# Patient Record
Sex: Female | Born: 1985 | Race: Black or African American | Hispanic: No | Marital: Single | Smoking: Never smoker
Health system: Southern US, Community
[De-identification: ages and names within clinical notes are randomized; demographics above are authoritative.]

## PROBLEM LIST (undated history)

## (undated) DIAGNOSIS — F419 Anxiety disorder, unspecified: Secondary | ICD-10-CM

## (undated) HISTORY — DX: Anxiety disorder, unspecified: F41.9

---

## 2011-05-19 ENCOUNTER — Emergency Department (INDEPENDENT_AMBULATORY_CARE_PROVIDER_SITE_OTHER)
Admission: EM | Admit: 2011-05-19 | Discharge: 2011-05-19 | Disposition: A | Discharge status: Home / Self Care | Payer: Self-pay | Source: Home / Self Care | Attending: Family Medicine | Admitting: Family Medicine

## 2011-05-19 ENCOUNTER — Encounter: Payer: Self-pay | Admitting: *Deleted

## 2011-05-19 DIAGNOSIS — H6980 Other specified disorders of Eustachian tube, unspecified ear: Secondary | ICD-10-CM

## 2011-05-19 DIAGNOSIS — S025XXA Fracture of tooth (traumatic), initial encounter for closed fracture: Secondary | ICD-10-CM

## 2011-05-19 MED ORDER — LORATADINE 10 MG PO TABS: 10.0000 mg | ORAL_TABLET | Freq: Every day | ORAL | Status: DC

## 2011-05-19 MED ORDER — FLUTICASONE PROPIONATE 50 MCG/ACT NA SUSP: 2.0000 | Freq: Every day | NASAL | Status: DC

## 2011-05-19 MED ORDER — HYDROCODONE-ACETAMINOPHEN 5-325 MG PO TABS: ORAL_TABLET | ORAL | Status: DC

## 2011-05-19 NOTE — ED Notes (Signed)
Pt  Reports  Symptoms  Of  Bilateral   Earache  As  Well as       Toothache  Which  She  Reports  She  Has  Had  For  About  Several  Weeks  She  Appears in  No  Distress  She  Is  Sitting on exam     Table        Talking on  Telephone

## 2011-05-19 NOTE — ED Provider Notes (Signed)
History     CSN: [id number] Arrival date & time: 05/19/2011  2:19 PM   First MD Initiated Contact with Patient 05/19/11 1630      Chief Complaint  Patient presents with  . Otalgia    (Consider location/radiation/quality/duration/timing/severity/associated sxs/prior treatment) Patient is a 25 y.o. female presenting with ear pain. The history is provided by the patient. The history is limited by a language barrier. A language interpreter was used.  Otalgia This is a new problem. The current episode started more than 2 days ago. There is pain in both ears. The problem occurs constantly. The problem has not changed since onset.There has been no fever. The pain is moderate. Pertinent negatives include no ear discharge, no hearing loss, no rhinorrhea, no sore throat and no cough. Her past medical history does not include hearing loss.    History reviewed. No pertinent past medical history.  History reviewed. No pertinent past surgical history.  History reviewed. No pertinent family history.  History  Substance Use Topics  . Smoking status: Not on file  . Smokeless tobacco: Not on file  . Alcohol Use: No    OB History    Grav Para Term Preterm Abortions TAB SAB Ect Mult Living                  Review of Systems  Constitutional: Negative.   HENT: Positive for ear pain. Negative for hearing loss, congestion, sore throat, rhinorrhea, sneezing, trouble swallowing, tinnitus and ear discharge.   Eyes: Negative.   Respiratory: Negative for cough, shortness of breath and wheezing.   Cardiovascular: Negative.   Gastrointestinal: Negative.   Genitourinary: Negative.     Allergies  Review of patient's allergies indicates no known allergies.  Home Medications  No current outpatient prescriptions on file.  BP 113/71  Pulse 52  Temp(Src) 98.5 F (36.9 C) (Oral)  Resp 16  SpO2 100%  LMP 05/18/2011  Physical Exam  Constitutional: She appears well-developed and well-nourished.    HENT:  Head: Normocephalic and atraumatic.  Right Ear: Hearing normal. No lacerations. No drainage. No foreign bodies. Tympanic membrane is retracted. Tympanic membrane is not erythematous and not bulging. No middle ear effusion. No hemotympanum.  Left Ear: Hearing normal. No lacerations. No drainage. No foreign bodies. Tympanic membrane is retracted. Tympanic membrane is not erythematous and not bulging.  No middle ear effusion. No hemotympanum.  Nose: Nose normal.  Mouth/Throat: Uvula is midline. Abnormal dentition. No dental abscesses.       Lower LEFT premolar cracked; no erythema or fluctuance  Eyes: EOM are normal. Pupils are equal, round, and reactive to light.  Cardiovascular: Normal rate and regular rhythm.   Pulmonary/Chest: Breath sounds normal.  Skin: Skin is warm and dry.    ED Course  Procedures (including critical care time)  Labs Reviewed - No data to display No results found.   No diagnosis found.    MDM  Will treat for eustachian tube dysfunction and refer to Medicaid accepting dental providers for repair of tooth.        Richardo Priest, MD 05/19/11 1700

## 2011-06-18 ENCOUNTER — Ambulatory Visit (HOSPITAL_COMMUNITY): Payer: Self-pay | Admitting: Physician Assistant

## 2011-06-23 ENCOUNTER — Ambulatory Visit (INDEPENDENT_AMBULATORY_CARE_PROVIDER_SITE_OTHER): Payer: Self-pay | Admitting: Psychology

## 2011-06-23 DIAGNOSIS — F4310 Post-traumatic stress disorder, unspecified: Secondary | ICD-10-CM
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
## 2011-06-24 ENCOUNTER — Encounter (HOSPITAL_COMMUNITY): Payer: Self-pay | Admitting: Psychology

## 2011-07-22 ENCOUNTER — Ambulatory Visit (INDEPENDENT_AMBULATORY_CARE_PROVIDER_SITE_OTHER): Payer: Self-pay | Admitting: Psychology

## 2011-07-22 DIAGNOSIS — F4310 Post-traumatic stress disorder, unspecified: Secondary | ICD-10-CM

## 2011-07-22 NOTE — Progress Notes (Signed)
   THERAPIST PROGRESS NOTE  Session Time: 1:05pm-1:30pm  Participation Level: Active  Behavioral Response: Well GroomedAlertEuthymic  Type of Therapy: Individual Therapy  Treatment Goals addressed: Diagnosis: PTSD  Interventions: Strength-based  Summary: Joanna Ashley is a 26 y.o. female who presents with interpreter w/ full and bright affect.  pt reports she is having no problems w/ mood, sleep or any concerns to be addressed in counseling.  pt feels she is dealing w/ normal transition to a new culture.  She feels that others in [employer] perceived her to be under stress she is not.  She denied any PTSD symptoms. Pt was aware if need counseling services in future she could return at that time.  Pt reported only complaint was some ear pain, toothache and not yet receiving her medicaid card.   Suicidal/Homicidal: Nowithout intent/plan  Therapist Response: Assessed pt current functioning per her report.  Discussed the purposes of services offered in this clinic and that services are voluntary.  Encouraged pt to f/u w/ [employer] case manager about other needs.    Plan: d/c from counseling as no current needs or symptoms per pt report.  Diagnosis: Axis I: PTSD- reported resolved    Axis II: No diagnosis    YATES,LEANNE, LPC 07/22/2011

## 2011-07-30 ENCOUNTER — Ambulatory Visit (HOSPITAL_COMMUNITY): Payer: Self-pay | Admitting: Physician Assistant

## 2011-08-06 ENCOUNTER — Encounter (HOSPITAL_COMMUNITY): Payer: Self-pay | Admitting: Psychology

## 2011-08-06 NOTE — Progress Notes (Signed)
Addended by: Chad Tiznado M on: 08/06/2011 10:06 AM   Modules accepted: Level of Service  

## 2011-08-06 NOTE — Progress Notes (Signed)
Outpatient Therapist Discharge Summary  Joanna Ashley    30-Jul-1985   Admission Date: 06/23/11   Discharge Date:  07/22/10 Reason for Discharge:  Treatment Completed/Pt reports no needs for tx Medications:  none Diagnosis:  Axis I:  v71.09   Axis II:  v71.09  Axis III:  none  Axis IV:  Refugee living in the [place]  Axis V:  70  Comments:  Pt attended intake and one f/u session- informing no need for services as doing well and no current PTSD symptoms  Forde Radon, [facility]

## 2011-09-03 ENCOUNTER — Ambulatory Visit (HOSPITAL_COMMUNITY): Payer: Self-pay | Admitting: Physician Assistant

## 2011-09-23 ENCOUNTER — Telehealth (HOSPITAL_COMMUNITY): Payer: Self-pay

## 2011-09-23 NOTE — Telephone Encounter (Signed)
FYI.....  Joanna Ashley from [facility] called to follow-up on Joanna Ashley.  I gave her the appointment status we had.  Joanna Ashley stated "Joanna Ashley may have a head injury which may be causing cognitive deficits".  Joanna Ashley also has a new Case Ashley that will better benefit her.  A meeting is scheduled with Joanna Ashley, new case Ashley, and Joanna Ashley next week (09/30/11) to discuss treatment options.  Joanna Ashley will follow-up with [place] after the meeting next week to see which direction her treatment needs to proceed.

## 2011-10-08 ENCOUNTER — Encounter (HOSPITAL_COMMUNITY): Payer: Self-pay | Admitting: *Deleted

## 2011-10-08 ENCOUNTER — Inpatient Hospital Stay (HOSPITAL_COMMUNITY)
Admission: AD | Admit: 2011-10-08 | Discharge: 2011-10-08 | Disposition: A | Discharge status: Home / Self Care | Payer: Medicaid Other | Source: Ambulatory Visit | Attending: Family Medicine | Admitting: Family Medicine

## 2011-10-08 DIAGNOSIS — N926 Irregular menstruation, unspecified: Secondary | ICD-10-CM | POA: Insufficient documentation

## 2011-10-08 LAB — POCT PREGNANCY, URINE: Preg Test, Ur: NEGATIVE

## 2011-10-08 LAB — WET PREP, GENITAL
Trich, Wet Prep: NONE SEEN
Yeast Wet Prep HPF POC: NONE SEEN

## 2011-10-08 NOTE — Discharge Instructions (Signed)
Make an appointment at the Health Dept.  You can be prescribed medicine to regulate your periods. You will need to go to Urgent Care for any additional medical needs Your ears look fine.  You can try taking some Sudafed by the package directions.  You can get this at a pharmacy.

## 2011-10-08 NOTE — MAU Note (Signed)
(  Tigrinya- language)  Irregular periods ( last 3 month) sometimes short - sometimes long.  Headache and pain.  Sometimes ear hurts, like there is fluid in it

## 2011-10-08 NOTE — MAU Provider Note (Signed)
Chart reviewed and agree with management and plan.  

## 2011-10-08 NOTE — MAU Provider Note (Signed)
History     CSN: [id number]  Arrival date and time: 10/08/11 0941   First Provider Initiated Contact with Patient 10/08/11 1050      No chief complaint on file.  HPI Joanna Ashley 26 y.o. MAU pregnancy test is negative.  Does not speak [place].  Phone translator used.  Comes to MAU with irreg menses x 3 months.  Also has low back pain and ear pain.  From current records is also an active client at [facility] but records are marked confidential and info is not available to this provider.  OB History    Grav Para Term Preterm Abortions TAB SAB Ect Mult Living   2 2 2       2       Past Medical History  Diagnosis Date  . Anxiety     History reviewed. No pertinent past surgical history.  History reviewed. No pertinent family history.  History  Substance Use Topics  . Smoking status: Never Smoker   . Smokeless tobacco: Never Used  . Alcohol Use: No    Allergies: No Known Allergies  No prescriptions prior to admission    Review of Systems  HENT: Positive for ear pain.   Gastrointestinal: Negative for abdominal pain.  Genitourinary: Negative for dysuria.       Irreg menses   Musculoskeletal: Positive for back pain.   Physical Exam   Blood pressure 115/74, pulse 66, temperature 97.1 F (36.2 C), temperature source Oral, resp. rate 20, height 5' 3.5" (1.613 m), weight 174 lb (78.926 kg), last menstrual period 10/03/2011, SpO2 99.00%.  Physical Exam  Nursing note and vitals reviewed. Constitutional: She is oriented to person, place, and time. She appears well-developed and well-nourished.  HENT:  Head: Normocephalic.       TM full bilaterally with good light reflex.  No redness  Eyes: EOM are normal.  Neck: Neck supple.  GI: Soft. There is no tenderness.  Genitourinary:       Speculum exam: Vulva - negative Vagina - minimal discharge, no odor Cervix - No contact bleeding Bimanual exam: Cervix closed Uterus non tender, normal size Adnexa non tender,  no masses bilaterally GC/Chlam, wet prep done Chaperone present for exam.  Musculoskeletal: Normal range of motion.  Neurological: She is alert and oriented to person, place, and time.  Skin: Skin is warm and dry.  Psychiatric: She has a normal mood and affect.    MAU Course  Procedures  MDM Results for orders placed during the hospital encounter of 10/08/11 (from the past 24 hour(s))  POCT PREGNANCY, URINE     Status: Normal   Collection Time   10/08/11 10:19 AM      Component Value Range   Preg Test, Ur NEGATIVE  NEGATIVE   WET PREP, GENITAL     Status: Abnormal   Collection Time   10/08/11 10:56 AM      Component Value Range   Yeast Wet Prep HPF POC NONE SEEN  NONE SEEN    Trich, Wet Prep NONE SEEN  NONE SEEN    Clue Cells Wet Prep HPF POC NONE SEEN  NONE SEEN    WBC, Wet Prep HPF POC FEW (*) NONE SEEN      Assessment and Plan  Irregular menses  Plan Make an appointment at the Health dept for annual exam and regulation of menses Explained to patient via phone interpreter. Case worker picked up client  - stated there was a meeting with the client last  week and continued services were offered at [facility] and client declined any further appointment. [facility] has been talking with client and advised bringing her here for evaluation based on her symptoms.  Joanna Ashley 10/08/2011, 10:58 AM

## 2011-10-09 ENCOUNTER — Encounter (HOSPITAL_COMMUNITY): Payer: Self-pay | Admitting: *Deleted

## 2012-01-05 ENCOUNTER — Ambulatory Visit
Admission: RE | Admit: 2012-01-05 | Discharge: 2012-01-05 | Disposition: A | Discharge status: Home / Self Care | Payer: No Typology Code available for payment source | Source: Ambulatory Visit | Attending: Infectious Diseases | Admitting: Infectious Diseases

## 2012-01-05 ENCOUNTER — Other Ambulatory Visit: Payer: Self-pay | Admitting: Infectious Diseases

## 2012-01-05 DIAGNOSIS — R7611 Nonspecific reaction to tuberculin skin test without active tuberculosis: Secondary | ICD-10-CM

## 2012-03-23 ENCOUNTER — Emergency Department (HOSPITAL_COMMUNITY)
Admission: EM | Admit: 2012-03-23 | Discharge: 2012-03-24 | Disposition: A | Discharge status: Home / Self Care | Payer: Medicaid Other | Attending: Emergency Medicine | Admitting: Emergency Medicine

## 2012-03-23 DIAGNOSIS — H1010 Acute atopic conjunctivitis, unspecified eye: Secondary | ICD-10-CM

## 2012-03-23 DIAGNOSIS — H1045 Other chronic allergic conjunctivitis: Secondary | ICD-10-CM | POA: Insufficient documentation

## 2012-03-23 NOTE — ED Notes (Signed)
Pt told EMS they speak [place], pt has been having eye pain for 5 hours, eyes red and watery.

## 2012-03-24 MED ORDER — TOBRAMYCIN 0.3 % OP SOLN
OPHTHALMIC | Status: AC
  Filled 2012-03-24: qty 5

## 2012-03-24 MED ORDER — TETRACAINE HCL 0.5 % OP SOLN
OPHTHALMIC | Status: AC
  Filled 2012-03-24: qty 2

## 2012-03-24 MED ORDER — DIPHENHYDRAMINE HCL 25 MG PO CAPS
ORAL_CAPSULE | ORAL | Status: AC
  Filled 2012-03-24: qty 1

## 2012-03-24 NOTE — ED Provider Notes (Signed)
History     CSN: [id number]  Arrival date & time 03/23/12  2308   First MD Initiated Contact with Patient 03/24/12 0002      Chief Complaint  Patient presents with  . Eye Pain   HPI  History provided by the patient through language interpreter. Patient is a 26 year old female who is a refugee from [place] who presents with complaints of bilateral eye burning and itching. Patient states that she began having some itching of her eyes over the past few days. Today she had some increased itching and have been rubbing her eyes frequently. After returning home from work she began having burning to her eyes in both eyes with increased tearing. Eyes were also sensitive to light. She denies having anything in her eyes. She has not used anything to treat her symptoms. She reports some blurry vision with the tears. She denies any vision loss or other vision changes. Patient denies having similar symptoms previously. She does report having some history of seasonal allergies. Patient states she is currently not taking any medications. Symptoms have been associated with some rhinorrhea. She denies any fever, chills, sweats, cough.     Past Medical History  Diagnosis Date  . Anxiety     No past surgical history on file.  No family history on file.  History  Substance Use Topics  . Smoking status: Never Smoker   . Smokeless tobacco: Never Used  . Alcohol Use: No    OB History    Grav Para Term Preterm Abortions TAB SAB Ect Mult Living   2 2 2       2       Review of Systems  Constitutional: Negative for fever and chills.  HENT: Positive for rhinorrhea.   Eyes: Positive for photophobia, pain, discharge, redness and itching. Negative for visual disturbance.  Respiratory: Negative for cough.   Gastrointestinal: Negative for nausea, vomiting and abdominal pain.    Allergies  Review of patient's allergies indicates no known allergies.  Home Medications   Current Outpatient Rx  Name  Route Sig Dispense Refill  . FLUTICASONE PROPIONATE 50 MCG/ACT NA SUSP Nasal Place 2 sprays into the nose daily. 16 g 2  . HYDROCODONE-ACETAMINOPHEN 5-325 MG PO TABS  Take one or two tablets every 4 to 6 hours as needed for severe pain 20 tablet 0  . LORATADINE 10 MG PO TABS Oral Take 1 tablet (10 mg total) by mouth daily. 30 tablet 0    BP 124/57  Pulse 71  Temp 98.4 F (36.9 C) (Oral)  Resp 18  SpO2 100%  Physical Exam  Nursing note and vitals reviewed. Constitutional: She is oriented to person, place, and time. She appears well-developed and well-nourished. No distress.  HENT:  Head: Normocephalic.  Eyes: EOM are normal. Pupils are equal, round, and reactive to light. Right eye exhibits discharge. Right eye exhibits no chemosis and no exudate. No foreign body present in the right eye. Left eye exhibits discharge. Left eye exhibits no chemosis and no exudate. No foreign body present in the left eye. Right conjunctiva is injected. Right conjunctiva has no hemorrhage. Left conjunctiva is injected. Left conjunctiva has no hemorrhage. No scleral icterus.  Slit lamp exam:      The right eye shows no corneal abrasion, no corneal ulcer, no foreign body, no hyphema, no hypopyon, no fluorescein uptake and no anterior chamber bulge.       The left eye shows no corneal abrasion, no corneal ulcer, no foreign  body, no hyphema, no hypopyon, no fluorescein uptake and no anterior chamber bulge.  Cardiovascular: Normal rate and regular rhythm.   Pulmonary/Chest: Effort normal and breath sounds normal.  Abdominal: Soft.  Neurological: She is alert and oriented to person, place, and time.  Skin: Skin is warm and dry. No rash noted.  Psychiatric: She has a normal mood and affect. Her behavior is normal.    ED Course  Procedures       1. Conjunctivitis, allergic       MDM  12:40AM patient seen and evaluated. No concerning findings on exam. Patient given Tobrex ophthalmic drops as well as  prescriptions for Norco. Patient given ophthalmology followup.        Angus Seller, [place] 03/24/12 2038

## 2012-03-25 NOTE — ED Provider Notes (Signed)
Medical screening examination/treatment/procedure(s) were performed by non-physician practitioner and as supervising physician I was immediately available for consultation/collaboration.   Richardean Canal, MD 03/25/12 [phone]

## 2012-11-26 ENCOUNTER — Emergency Department (INDEPENDENT_AMBULATORY_CARE_PROVIDER_SITE_OTHER)
Admission: EM | Admit: 2012-11-26 | Discharge: 2012-11-26 | Disposition: A | Discharge status: Home / Self Care | Payer: Self-pay | Source: Home / Self Care | Attending: Family Medicine | Admitting: Family Medicine

## 2012-11-26 ENCOUNTER — Encounter (HOSPITAL_COMMUNITY): Payer: Self-pay | Admitting: Emergency Medicine

## 2012-11-26 DIAGNOSIS — J309 Allergic rhinitis, unspecified: Secondary | ICD-10-CM

## 2012-11-26 LAB — POCT PREGNANCY, URINE: Preg Test, Ur: NEGATIVE

## 2012-11-26 LAB — POCT RAPID STREP A: Streptococcus, Group A Screen (Direct): NEGATIVE

## 2012-11-26 LAB — POCT URINALYSIS DIP (DEVICE)
Hgb urine dipstick: NEGATIVE
Protein, ur: NEGATIVE mg/dL
Specific Gravity, Urine: 1.025 (ref 1.005–1.030)
Urobilinogen, UA: 0.2 mg/dL (ref 0.0–1.0)
pH: 5.5 (ref 5.0–8.0)

## 2012-11-26 MED ORDER — CETIRIZINE-PSEUDOEPHEDRINE ER 5-120 MG PO TB12: 1.0000 | ORAL_TABLET | Freq: Two times a day (BID) | ORAL | Status: DC | PRN

## 2012-11-26 MED ORDER — IBUPROFEN 600 MG PO TABS: 600.0000 mg | ORAL_TABLET | Freq: Three times a day (TID) | ORAL | Status: DC | PRN

## 2012-11-26 MED ORDER — PREDNISONE 20 MG PO TABS: ORAL_TABLET | ORAL | Status: DC

## 2012-11-26 NOTE — ED Notes (Signed)
Pt c/o vaginal pain x 1 year. Has small amounts of discharge sometimes. Was seen for sx before and was told nothing is wrong. Also c/o sore throat x 1-2 weeks. Felt like malaria sx, fever and body aches. Patient is alert and oriented.
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
## 2013-12-28 ENCOUNTER — Emergency Department (HOSPITAL_COMMUNITY)
Admission: EM | Admit: 2013-12-28 | Discharge: 2013-12-28 | Disposition: A | Discharge status: Home / Self Care | Payer: Self-pay | Source: Home / Self Care | Attending: Family Medicine | Admitting: Family Medicine

## 2013-12-28 ENCOUNTER — Encounter (HOSPITAL_COMMUNITY): Payer: Self-pay | Admitting: Emergency Medicine

## 2013-12-28 DIAGNOSIS — R51 Headache: Secondary | ICD-10-CM

## 2013-12-28 DIAGNOSIS — R519 Headache, unspecified: Secondary | ICD-10-CM

## 2013-12-28 LAB — POCT I-STAT, CHEM 8
BUN: 4 mg/dL — AB (ref 6–23)
CHLORIDE: 100 meq/L (ref 96–112)
Calcium, Ion: 1.28 mmol/L — ABNORMAL HIGH (ref 1.12–1.23)
Creatinine, Ser: 0.6 mg/dL (ref 0.50–1.10)
Glucose, Bld: 98 mg/dL (ref 70–99)
HCT: 43 % (ref 36.0–46.0)
Hemoglobin: 14.6 g/dL (ref 12.0–15.0)
POTASSIUM: 3.8 meq/L (ref 3.7–5.3)
SODIUM: 144 meq/L (ref 137–147)
TCO2: 26 mmol/L (ref 0–100)

## 2013-12-28 LAB — CBC
HCT: 41 % (ref 36.0–46.0)
HEMOGLOBIN: 13.8 g/dL (ref 12.0–15.0)
MCH: 30.9 pg (ref 26.0–34.0)
MCHC: 33.7 g/dL (ref 30.0–36.0)
MCV: 91.7 fL (ref 78.0–100.0)
PLATELETS: 327 10*3/uL (ref 150–400)
RBC: 4.47 MIL/uL (ref 3.87–5.11)
RDW: 11.9 % (ref 11.5–15.5)
WBC: 6.6 10*3/uL (ref 4.0–10.5)

## 2013-12-28 LAB — POCT URINALYSIS DIP (DEVICE)
BILIRUBIN URINE: NEGATIVE
GLUCOSE, UA: NEGATIVE mg/dL
Hgb urine dipstick: NEGATIVE
KETONES UR: NEGATIVE mg/dL
LEUKOCYTES UA: NEGATIVE
NITRITE: NEGATIVE
Protein, ur: NEGATIVE mg/dL
Specific Gravity, Urine: 1.015 (ref 1.005–1.030)
Urobilinogen, UA: 0.2 mg/dL (ref 0.0–1.0)
pH: 8.5 — ABNORMAL HIGH (ref 5.0–8.0)

## 2013-12-28 LAB — POCT PREGNANCY, URINE: PREG TEST UR: NEGATIVE

## 2013-12-28 MED ORDER — NAPROXEN 500 MG PO TABS: 500.0000 mg | ORAL_TABLET | Freq: Two times a day (BID) | ORAL | Status: DC

## 2013-12-28 NOTE — ED Provider Notes (Signed)
CSN: [id number]     Arrival date & time 12/28/13  1122 History   First MD Initiated Contact with Patient 12/28/13 1144     Chief Complaint  Patient presents with  . Headache   (Consider location/radiation/quality/duration/timing/severity/associated sxs/prior Treatment) HPI Comments: No early morning headaches. Pain does not wake patient from sleep.   Patient is a 28 y.o. female presenting with headaches. The history is provided by the patient. The history is limited by a language barrier. A language interpreter was used.  Headache Pain location:  L temporal and R temporal Quality: "throbbing/pounding"  Onset quality:  Gradual Duration: symptoms began 4 months ago. Timing:  Constant Progression:  Unchanged Chronicity:  New Similar to prior headaches: yes (She states she had a headache similar to this when she was diagnosed and treated for malaria 2 & 1/2 years ago while living in [place])   Associated symptoms: congestion, fever and nausea   Associated symptoms: no abdominal pain, no back pain, no blurred vision, no cough, no diarrhea, no dizziness, no drainage, no ear pain, no pain, no facial pain, no fatigue, no focal weakness, no hearing loss, no loss of balance, no myalgias, no near-syncope, no neck pain, no neck stiffness, no numbness, no paresthesias, no photophobia, no seizures, no sinus pressure, no sore throat, no swollen glands, no syncope, no tingling, no URI, no visual change, no vomiting and no weakness   Associated symptoms comment:  +fever is subjective   Past Medical History  Diagnosis Date  . Anxiety    History reviewed. No pertinent past surgical history. History reviewed. No pertinent family history. History  Substance Use Topics  . Smoking status: Never Smoker   . Smokeless tobacco: Never Used  . Alcohol Use: No   OB History   Grav Para Term Preterm Abortions TAB SAB Ect Mult Living   2 2 2       2      Review of Systems  Constitutional: Positive for  fever. Negative for fatigue.  HENT: Positive for congestion. Negative for ear pain, hearing loss, postnasal drip, sinus pressure and sore throat.   Eyes: Negative for blurred vision, photophobia and pain.  Respiratory: Negative for cough.   Cardiovascular: Negative.  Negative for syncope and near-syncope.  Gastrointestinal: Positive for nausea. Negative for vomiting, abdominal pain and diarrhea.  Endocrine: Negative for polydipsia, polyphagia and polyuria.  Genitourinary: Negative.   Musculoskeletal: Negative for back pain, myalgias, neck pain and neck stiffness.  Skin: Negative.   Neurological: Positive for headaches. Negative for dizziness, focal weakness, seizures, numbness, paresthesias and loss of balance.    Allergies  Review of patient's allergies indicates no known allergies.  Home Medications   Prior to Admission medications   Medication Sig Start Date End Date Taking? Authorizing Provider  cetirizine-pseudoephedrine (ZYRTEC-D) 5-120 MG per tablet Take 1 tablet by mouth 2 (two) times daily as needed for allergies or rhinitis. 11/26/12   Adlih Moreno-Coll, MD  ibuprofen (ADVIL,MOTRIN) 600 MG tablet Take 1 tablet (600 mg total) by mouth every 8 (eight) hours as needed for pain (take with food). 11/26/12   Adlih Moreno-Coll, MD  naproxen (NAPROSYN) 500 MG tablet Take 1 tablet (500 mg total) by mouth 2 (two) times daily. As needed for pain 12/28/13   Ardis RowanJennifer Lee Presson, PA  predniSONE (DELTASONE) 20 MG tablet 2 tabs po daily for 5 days 11/26/12   Adlih Moreno-Coll, MD   BP 121/86  Pulse 61  Temp(Src) 98.4 F (36.9 C) (Oral)  Resp 12  SpO2 100% Physical Exam  Nursing note and vitals reviewed. Constitutional: She is oriented to person, place, and time. She appears well-developed and well-nourished. No distress.  HENT:  Head: Normocephalic and atraumatic.  Right Ear: Hearing, tympanic membrane, external ear and ear canal normal.  Left Ear: Hearing, tympanic membrane, external ear  and ear canal normal.  Nose: Nose normal.  Mouth/Throat: Uvula is midline, oropharynx is clear and moist and mucous membranes are normal.  Eyes: Conjunctivae and EOM are normal. Pupils are equal, round, and reactive to light. Right eye exhibits no discharge. Left eye exhibits no discharge. No scleral icterus.  Patient unable to participate in funduscopic exam  Neck: Normal range of motion. Neck supple. No thyromegaly present.  Cardiovascular: Normal rate, regular rhythm and normal heart sounds.   Pulmonary/Chest: Effort normal and breath sounds normal. No respiratory distress. She has no wheezes.  Abdominal: Soft. Bowel sounds are normal. She exhibits no distension. There is no tenderness.  Lymphadenopathy:    She has no cervical adenopathy.  Neurological: She is alert and oriented to person, place, and time.  Skin: Skin is warm and dry. No rash noted. No erythema.  Psychiatric: She has a normal mood and affect. Her behavior is normal.    ED Course  Procedures (including critical care time) Labs Review Labs Reviewed  POCT URINALYSIS DIP (DEVICE) - Abnormal; Notable for the following:    pH 8.5 (*)    All other components within normal limits  POCT I-STAT, CHEM 8 - Abnormal; Notable for the following:    BUN 4 (*)    Calcium, Ion 1.28 (*)    All other components within normal limits  MALARIA SMEAR  CBC  POCT PREGNANCY, URINE    Imaging Review No results found.   MDM   1. Headache    CBC, Istat-8 and UA unremarkable. UPT negative.  Malaria smear negative for organisms. Physical exam without worrisome finding or deficit to indicate need for urgent brain imaging. Will treat with naprosyn and [facility]. Reviewed discharge instructions using interpreter and patient voices understanding of red flag reasons for urgent return and urgent re-evaluation.     Jess BartersJennifer Lee HolcombPresson, [place]PA 12/28/13 1434

## 2013-12-28 NOTE — ED Notes (Addendum)
C/o HA x 4 months. When attempted to inquire further, pt said, no more English (still cannot understand what language pt speaks ) old record indicates patient speaks Tigrinian

## 2013-12-28 NOTE — ED Provider Notes (Signed)
Medical screening examination/treatment/procedure(s) were performed by a resident physician or non-physician practitioner and as the supervising physician I was immediately available for consultation/collaboration.  Clementeen GrahamEvan Corey, MD   Rodolph BongEvan S Corey, MD 12/28/13 Rosamaria [id number]

## 2013-12-28 NOTE — Discharge Instructions (Signed)
Your test for malaria was negative. Your other blood work and urine studies were also normal. I would advise you to take medication as prescribed for pain and contact the facility listed on your paperwork to establish for primary care and re-evaluation if your headache symptoms continue. If symptoms become suddenly worse or severe or pain wakes you from sleep, please report to your nearest Emergency Room for assistance.   Headaches, Frequently Asked Questions MIGRAINE HEADACHES Q: What is migraine? What causes it? How can I treat it? A: Generally, migraine headaches begin as a dull ache. Then they develop into a constant, throbbing, and pulsating pain. You may experience pain at the temples. You may experience pain at the front or back of one or both sides of the head. The pain is usually accompanied by a combination of:  Nausea.  Vomiting.  Sensitivity to light and noise. Some people (about 15%) experience an aura (see below) before an attack. The cause of migraine is believed to be chemical reactions in the brain. Treatment for migraine may include over-the-counter or prescription medications. It may also include self-help techniques. These include relaxation training and biofeedback.  Q: What is an aura? A: About 15% of people with migraine get an "aura". This is a sign of neurological symptoms that occur before a migraine headache. You may see wavy or jagged lines, dots, or flashing lights. You might experience tunnel vision or blind spots in one or both eyes. The aura can include visual or auditory hallucinations (something imagined). It may include disruptions in smell (such as strange odors), taste or touch. Other symptoms include:  Numbness.  A "pins and needles" sensation.  Difficulty in recalling or speaking the correct word. These neurological events may last as long as 60 minutes. These symptoms will fade as the headache begins. Q: What is a trigger? A: Certain physical or  environmental factors can lead to or "trigger" a migraine. These include:  Foods.  Hormonal changes.  Weather.  Stress. It is important to remember that triggers are different for everyone. To help prevent migraine attacks, you need to figure out which triggers affect you. Keep a headache diary. This is a good way to track triggers. The diary will help you talk to your healthcare professional about your condition. Q: Does weather affect migraines? A: Bright sunshine, hot, humid conditions, and drastic changes in barometric pressure may lead to, or "trigger," a migraine attack in some people. But studies have shown that weather does not act as a trigger for everyone with migraines. Q: What is the link between migraine and hormones? A: Hormones start and regulate many of your body's functions. Hormones keep your body in balance within a constantly changing environment. The levels of hormones in your body are unbalanced at times. Examples are during menstruation, pregnancy, or menopause. That can lead to a migraine attack. In fact, about three quarters of all women with migraine report that their attacks are related to the menstrual cycle.  Q: Is there an increased risk of stroke for migraine sufferers? A: The likelihood of a migraine attack causing a stroke is very remote. That is not to say that migraine sufferers cannot have a stroke associated with their migraines. In persons under age 28, the most common associated factor for stroke is migraine headache. But over the course of a person's normal life span, the occurrence of migraine headache may actually be associated with a reduced risk of dying from cerebrovascular disease due to stroke.  Q: What  are acute medications for migraine? A: Acute medications are used to treat the pain of the headache after it has started. Examples over-the-counter medications, NSAIDs, ergots, and triptans.  Q: What are the triptans? A: Triptans are the newest class  of abortive medications. They are specifically targeted to treat migraine. Triptans are vasoconstrictors. They moderate some chemical reactions in the brain. The triptans work on receptors in your brain. Triptans help to restore the balance of a neurotransmitter called serotonin. Fluctuations in levels of serotonin are thought to be a main cause of migraine.  Q: Are over-the-counter medications for migraine effective? A: Over-the-counter, or "OTC," medications may be effective in relieving mild to moderate pain and associated symptoms of migraine. But you should see your caregiver before beginning any treatment regimen for migraine.  Q: What are preventive medications for migraine? A: Preventive medications for migraine are sometimes referred to as "prophylactic" treatments. They are used to reduce the frequency, severity, and length of migraine attacks. Examples of preventive medications include antiepileptic medications, antidepressants, beta-blockers, calcium channel blockers, and NSAIDs (nonsteroidal anti-inflammatory drugs). Q: Why are anticonvulsants used to treat migraine? A: During the past few years, there has been an increased interest in antiepileptic drugs for the prevention of migraine. They are sometimes referred to as "anticonvulsants". Both epilepsy and migraine may be caused by similar reactions in the brain.  Q: Why are antidepressants used to treat migraine? A: Antidepressants are typically used to treat people with depression. They may reduce migraine frequency by regulating chemical levels, such as serotonin, in the brain.  Q: What alternative therapies are used to treat migraine? A: The term "alternative therapies" is often used to describe treatments considered outside the scope of conventional Western medicine. Examples of alternative therapy include acupuncture, acupressure, and yoga. Another common alternative treatment is herbal therapy. Some herbs are believed to relieve  headache pain. Always discuss alternative therapies with your caregiver before proceeding. Some herbal products contain arsenic and other toxins. TENSION HEADACHES Q: What is a tension-type headache? What causes it? How can I treat it? A: Tension-type headaches occur randomly. They are often the result of temporary stress, anxiety, fatigue, or anger. Symptoms include soreness in your temples, a tightening band-like sensation around your head (a "vice-like" ache). Symptoms can also include a pulling feeling, pressure sensations, and contracting head and neck muscles. The headache begins in your forehead, temples, or the back of your head and neck. Treatment for tension-type headache may include over-the-counter or prescription medications. Treatment may also include self-help techniques such as relaxation training and biofeedback. CLUSTER HEADACHES Q: What is a cluster headache? What causes it? How can I treat it? A: Cluster headache gets its name because the attacks come in groups. The pain arrives with little, if any, warning. It is usually on one side of the head. A tearing or bloodshot eye and a runny nose on the same side of the headache may also accompany the pain. Cluster headaches are believed to be caused by chemical reactions in the brain. They have been described as the most severe and intense of any headache type. Treatment for cluster headache includes prescription medication and oxygen. SINUS HEADACHES Q: What is a sinus headache? What causes it? How can I treat it? A: When a cavity in the bones of the face and skull (a sinus) becomes inflamed, the inflammation will cause localized pain. This condition is usually the result of an allergic reaction, a tumor, or an infection. If your headache is caused by  a sinus blockage, such as an infection, you will probably have a fever. An x-ray will confirm a sinus blockage. Your caregiver's treatment might include antibiotics for the infection, as well as  antihistamines or decongestants.  REBOUND HEADACHES Q: What is a rebound headache? What causes it? How can I treat it? A: A pattern of taking acute headache medications too often can lead to a condition known as "rebound headache." A pattern of taking too much headache medication includes taking it more than 2 days per week or in excessive amounts. That means more than the label or a caregiver advises. With rebound headaches, your medications not only stop relieving pain, they actually begin to cause headaches. Doctors treat rebound headache by tapering the medication that is being overused. Sometimes your caregiver will gradually substitute a different type of treatment or medication. Stopping may be a challenge. Regularly overusing a medication increases the potential for serious side effects. Consult a caregiver if you regularly use headache medications more than 2 days per week or more than the label advises. ADDITIONAL QUESTIONS AND ANSWERS Q: What is biofeedback? A: Biofeedback is a self-help treatment. Biofeedback uses special equipment to monitor your body's involuntary physical responses. Biofeedback monitors:  Breathing.  Pulse.  Heart rate.  Temperature.  Muscle tension.  Brain activity. Biofeedback helps you refine and perfect your relaxation exercises. You learn to control the physical responses that are related to stress. Once the technique has been mastered, you do not need the equipment any more. Q: Are headaches hereditary? A: Four out of five (80%) of people that suffer report a family history of migraine. Scientists are not sure if this is genetic or a family predisposition. Despite the uncertainty, a child has a 50% chance of having migraine if one parent suffers. The child has a 75% chance if both parents suffer.  Q: Can children get headaches? A: By the time they reach high school, most young people have experienced some type of headache. Many safe and effective  approaches or medications can prevent a headache from occurring or stop it after it has begun.  Q: What type of doctor should I see to diagnose and treat my headache? A: Start with your primary caregiver. Discuss his or her experience and approach to headaches. Discuss methods of classification, diagnosis, and treatment. Your caregiver may decide to recommend you to a headache specialist, depending upon your symptoms or other physical conditions. Having diabetes, allergies, etc., may require a more comprehensive and inclusive approach to your headache. The National Headache Foundation will provide, upon request, a list of [facility] physician members in your state. Document Released: 09/20/2003 Document Revised: 09/22/2011 Document Reviewed: 02/28/2008 [facility] Patient Information 2015 [place], [place]. This information is not intended to replace advice given to you by your health care provider. Make sure you discuss any questions you have with your health care provider.

## 2013-12-30 LAB — MALARIA SMEAR

## 2014-05-15 ENCOUNTER — Encounter (HOSPITAL_COMMUNITY): Payer: Self-pay | Admitting: Emergency Medicine

## 2015-12-31 ENCOUNTER — Emergency Department (HOSPITAL_COMMUNITY)
Admission: EM | Admit: 2015-12-31 | Discharge: 2015-12-31 | Disposition: A | Discharge status: Home / Self Care | Payer: BLUE CROSS/BLUE SHIELD | Attending: Emergency Medicine | Admitting: Emergency Medicine

## 2015-12-31 ENCOUNTER — Emergency Department (HOSPITAL_COMMUNITY): Payer: BLUE CROSS/BLUE SHIELD

## 2015-12-31 ENCOUNTER — Encounter (HOSPITAL_COMMUNITY): Payer: Self-pay

## 2015-12-31 ENCOUNTER — Other Ambulatory Visit: Payer: Self-pay

## 2015-12-31 DIAGNOSIS — R51 Headache: Secondary | ICD-10-CM | POA: Diagnosis present

## 2015-12-31 DIAGNOSIS — R519 Headache, unspecified: Secondary | ICD-10-CM

## 2015-12-31 DIAGNOSIS — Z79899 Other long term (current) drug therapy: Secondary | ICD-10-CM | POA: Diagnosis not present

## 2015-12-31 LAB — CBG MONITORING, ED: GLUCOSE-CAPILLARY: 102 mg/dL — AB (ref 65–99)

## 2015-12-31 MED ORDER — METOCLOPRAMIDE HCL 10 MG PO TABS
10.0000 mg | ORAL_TABLET | Freq: Once | ORAL | Status: AC
  Administered 2015-12-31: 10 mg via ORAL
  Filled 2015-12-31: qty 1

## 2015-12-31 MED ORDER — DIPHENHYDRAMINE HCL 25 MG PO CAPS
25.0000 mg | ORAL_CAPSULE | Freq: Once | ORAL | Status: AC
  Administered 2015-12-31: 25 mg via ORAL
  Filled 2015-12-31: qty 1

## 2015-12-31 MED ORDER — IBUPROFEN 400 MG PO TABS
600.0000 mg | ORAL_TABLET | Freq: Once | ORAL | Status: AC
  Administered 2015-12-31: 600 mg via ORAL
  Filled 2015-12-31: qty 1

## 2015-12-31 MED ORDER — ACETAMINOPHEN 325 MG PO TABS
650.0000 mg | ORAL_TABLET | Freq: Once | ORAL | Status: AC
  Administered 2015-12-31: 650 mg via ORAL
  Filled 2015-12-31: qty 2

## 2015-12-31 NOTE — Discharge Instructions (Signed)

## 2015-12-31 NOTE — ED Notes (Signed)
Pt complaining of headache since Friday. Pt states pain radiates to front of head. Denies any nausea or vomiting. Pt states some lightheadedness when standing. States hx: of same. Pt speaks minimal english, speaks [place].

## 2015-12-31 NOTE — ED Provider Notes (Signed)
CSN: [id number]     Arrival date & time 12/31/15  1544 History   First MD Initiated Contact with Patient 12/31/15 1659     Chief Complaint  Patient presents with  . Headache    HPI Comments: 30 y.o. female otherwise healthy presents to the ED due to headache for the last 4 days. It was gradual in onset and has an waxing and waning. She has not taken any medication for this at home. No aggravating or alleviating factors.  Patient is a 30 y.o. female presenting with headaches. The history is provided by the patient. The history is limited by a language barrier. A language interpreter was used (via telephone service).  Headache Pain location:  Generalized Quality:  Sharp Radiates to:  Does not radiate Severity at highest:  10/10 Onset quality:  Gradual Duration:  4 days Timing:  Intermittent Progression:  Waxing and waning Chronicity:  New Similar to prior headaches: yes   Relieved by:  Nothing Worsened by:  Nothing Ineffective treatments:  None tried Associated symptoms: fever   Associated symptoms: no abdominal pain, no congestion, no nausea, no neck pain, no neck stiffness, no visual change, no vomiting and no weakness   Fever:    Temp source:  Subjective   Past Medical History  Diagnosis Date  . Anxiety    History reviewed. No pertinent past surgical history. History reviewed. No pertinent family history. Social History  Substance Use Topics  . Smoking status: Never Smoker   . Smokeless tobacco: Never Used  . Alcohol Use: No   OB History    Gravida Para Term Preterm AB TAB SAB Ectopic Multiple Living   Review of Systems  Constitutional: Positive for fever.  HENT: Negative for congestion.   Eyes: Negative for redness.  Respiratory: Negative for shortness of breath.   Cardiovascular: Negative for chest pain.  Gastrointestinal: Negative for nausea, vomiting and abdominal pain.  Genitourinary: Negative for flank pain.  Musculoskeletal: Negative  for gait problem, neck pain and neck stiffness.  Skin: Negative for rash.  Neurological: Positive for light-headedness and headaches. Negative for weakness.  Psychiatric/Behavioral: Negative for confusion.      Allergies  Review of patient's allergies indicates no known allergies.  Home Medications   Prior to Admission medications   Medication Sig Start Date End Date Taking? Authorizing Provider  cetirizine-pseudoephedrine (ZYRTEC-D) 5-120 MG per tablet Take 1 tablet by mouth 2 (two) times daily as needed for allergies or rhinitis. 11/26/12   Adlih Moreno-Coll, MD  ibuprofen (ADVIL,MOTRIN) 600 MG tablet Take 1 tablet (600 mg total) by mouth every 8 (eight) hours as needed for pain (take with food). 11/26/12   Adlih Moreno-Coll, MD  naproxen (NAPROSYN) 500 MG tablet Take 1 tablet (500 mg total) by mouth 2 (two) times daily. As needed for pain 12/28/13   Mathis Fare Presson, PA  predniSONE (DELTASONE) 20 MG tablet 2 tabs po daily for 5 days 11/26/12   Adlih Moreno-Coll, MD   BP 125/68 mmHg  Pulse 78  Temp(Src) 98.3 F (36.8 C) (Oral)  Resp 16  SpO2 96% Physical Exam  Constitutional: She is oriented to person, place, and time. She appears well-developed and well-nourished. No distress.  HENT:  Head: Normocephalic and atraumatic.  Right Ear: External ear normal.  Left Ear: External ear normal.  Mouth/Throat: Oropharynx is clear and moist.  Eyes: EOM are normal. Pupils are equal, round, and reactive to  light.  Neck: Normal range of motion. Neck supple.  Cardiovascular: Normal rate and regular rhythm.   Pulmonary/Chest: Effort normal and breath sounds normal.  Abdominal: Soft. She exhibits no distension. There is no tenderness.  Neurological: She is alert and oriented to person, place, and time. Gait normal.  Face symmetric, speech clear and appropriate, normal strength in major muscle groups of upper and lower extremities, sensation to light touch is intact in all extremities, normal  gait  Skin: Skin is warm and dry. No rash noted. She is not diaphoretic. No pallor.  Psychiatric: She has a normal mood and affect.    ED Course  Procedures   Labs Review Labs Reviewed  CBG MONITORING, ED - Abnormal; Notable for the following:    Glucose-Capillary 102 (*)    All other components within normal limits    Imaging Review Ct Head Wo Contrast  12/31/2015  CLINICAL DATA:  Headache. EXAM: CT HEAD WITHOUT CONTRAST TECHNIQUE: Contiguous axial images were obtained from the base of the skull through the vertex without intravenous contrast. COMPARISON:  None. FINDINGS: No mass lesion. No midline shift. No acute hemorrhage or hematoma. No extra-axial fluid collections. No evidence of acute infarction. Brain parenchyma is normal. Bones are normal. IMPRESSION: Normal exam. Electronically Signed   By: Francene BoyersJames  Maxwell M.D.   On: 12/31/2015 18:32   I have personally reviewed and evaluated these images and lab results as part of my medical decision-making.   EKG Interpretation   Date/Time:  Monday December 31 2015 19:13:21 EDT Ventricular Rate:  64 PR Interval:    QRS Duration: 63 QT Interval:  368 QTC Calculation: 380 R Axis:   70 Text Interpretation:  Normal sinus rhythm Confirmed by RAY MD, Duwayne HeckANIELLE  ([id number]) on 12/31/2015 8:20:57 PM      MDM   Final diagnoses:  Nonintractable headache, unspecified chronicity pattern, unspecified headache type    30 y.o. female otherwise healthy presents to the ED due to headache for the last 4 days. It was gradual in onset and has an waxing and waning. She has not taken any medication for this at home. No aggravating or alleviating factors. Remainder of history of present illness, review of systems, exam as above.  She has a benign, nonfocal neuro exam. She ambulates with a steady gait. She has no rash, fever, nausea, vomiting. No history of trauma.  CT head was obtained which showed no acute intracranial abnormality. Labs from 4 days ago are  reviewed by me and were unremarkable. She is not pregnant, no UTI, no malaria. EKG here shows normal sinus rhythm with no interval abnormalities, Brugada, WPW. Glucose normal here.  I do not suspect subarachnoid hemorrhage, epidural hematoma, temporal arteritis, space-occupying lesion. She was given a migraine cocktail. On reassessment she continues to endorse left sided head pain. She was given a dose of ibuprofen.  On reassessment she is smiling and laughing, playing with her child. She feels better and wants to go home.   Return precautions and follow-up information given. Discharged home.  Case managed in conjunction with my attending, Dr. Rosalia Hammersay.  Maxine GlennAnn Zakir Henner, MD 12/31/15 [phone]  Margarita Grizzleanielle Ray, MD 12/31/15 [phone]

## 2018-11-04 ENCOUNTER — Encounter (HOSPITAL_COMMUNITY): Payer: Self-pay

## 2018-11-04 ENCOUNTER — Ambulatory Visit (HOSPITAL_COMMUNITY)
Admission: EM | Admit: 2018-11-04 | Discharge: 2018-11-04 | Disposition: A | Discharge status: Home / Self Care | Payer: HRSA Program | Attending: Internal Medicine | Admitting: Internal Medicine

## 2018-11-04 ENCOUNTER — Other Ambulatory Visit: Payer: Self-pay

## 2018-11-04 DIAGNOSIS — J988 Other specified respiratory disorders: Secondary | ICD-10-CM | POA: Diagnosis not present

## 2018-11-04 MED ORDER — ONDANSETRON 4 MG PO TBDP: 4.0000 mg | ORAL_TABLET | Freq: Three times a day (TID) | ORAL | 0 refills | Status: DC | PRN

## 2018-11-04 MED ORDER — ACETAMINOPHEN 325 MG PO TABS: 650.0000 mg | ORAL_TABLET | Freq: Four times a day (QID) | ORAL | Status: DC | PRN

## 2018-11-04 NOTE — ED Provider Notes (Addendum)
[facility]    CSN: [id number] Arrival date & time: 11/04/18  1330     History   Chief Complaint Chief Complaint  Patient presents with  . Generalized Body Aches    HPI Joanna Ashley is a 33 y.o. female comes to urgent care with generalized body aches, fever and a cough of 4 days duration.  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  She says symptoms started on Sunday and has gotten progressively worse. No sputum production. No known relieving factors. She has generalized body aches, headaches and increased fatigue.  She has some nausea without vomiting.  No diarrhea.  Appetite is fair.  Patient denies any sick contacts. HPI  Past Medical History:  Diagnosis Date  . Anxiety     There are no active problems to display for this patient.   History reviewed. No pertinent surgical history.  OB History    Gravida  2   Para  2   Term  2   Preterm      AB      Living  2     SAB      TAB      Ectopic      Multiple      Live Births               Home Medications    Prior to Admission medications   Medication Sig Start Date End Date Taking? Authorizing Provider  acetaminophen (TYLENOL) 325 MG tablet Take 2 tablets (650 mg total) by mouth every 6 (six) hours as needed. 11/04/18   Merrilee Jansky, MD  ondansetron (ZOFRAN ODT) 4 MG disintegrating tablet Take 1 tablet (4 mg total) by mouth every 8 (eight) hours as needed for nausea or vomiting. 11/04/18   Elease Swarm, Britta Mccreedy, MD    Family History History reviewed. No pertinent family history.  Social History Social History   Tobacco Use  . Smoking status: Never Smoker  . Smokeless tobacco: Never Used  Substance Use Topics  . Alcohol use: No  . Drug use: No     Allergies   Patient has no known allergies.   Review of Systems Review of Systems  Constitutional: Positive for activity change, appetite change, chills, fatigue and fever.  HENT: Negative.   Eyes: Negative.    Respiratory: Positive for cough. Negative for chest tightness, shortness of breath and wheezing.   Cardiovascular: Negative.   Gastrointestinal: Positive for nausea. Negative for diarrhea and vomiting.  Endocrine: Negative.   Genitourinary: Negative for dysuria, frequency, hematuria and urgency.  Musculoskeletal: Positive for arthralgias and myalgias. Negative for back pain.  Skin: Negative.   Allergic/Immunologic: Negative.   Neurological: Positive for weakness. Negative for dizziness, syncope and light-headedness.     Physical Exam Triage Vital Signs ED Triage Vitals  Enc Vitals Group     BP 11/04/18 1413 117/81     Pulse Rate 11/04/18 1413 71     Resp 11/04/18 1413 17     Temp 11/04/18 1413 97.9 F (36.6 C)     Temp Source 11/04/18 1413 Oral     SpO2 11/04/18 1413 99 %     Weight --      Height --      Head Circumference --      Peak Flow --      Pain Score 11/04/18 1402 8     Pain Loc --      Pain Edu? --  Excl. in GC? --    No data found.  Updated Vital Signs BP 117/81 (BP Location: Left Arm)   Pulse 71   Temp 97.9 F (36.6 C) (Oral)   Resp 17   SpO2 99%   Visual Acuity Right Eye Distance:   Left Eye Distance:   Bilateral Distance:    Right Eye Near:   Left Eye Near:    Bilateral Near:     Physical Exam Vitals signs and nursing note reviewed.  Constitutional:      Appearance: She is ill-appearing. She is not toxic-appearing.  Neck:     Musculoskeletal: Normal range of motion.  Pulmonary:     Effort: Pulmonary effort is normal. No respiratory distress.     Breath sounds: No stridor. No wheezing or rales.  Abdominal:     General: Bowel sounds are normal. There is no distension.     Tenderness: There is no abdominal tenderness. There is no guarding.  Musculoskeletal: Normal range of motion.  Skin:    General: Skin is warm.  Neurological:     General: No focal deficit present.     Mental Status: She is alert and oriented to person, place,  and time.  Psychiatric:        Behavior: Behavior normal.      UC Treatments / Results  Labs (all labs ordered are listed, but only abnormal results are displayed) Labs Reviewed - No data to display  EKG None  Radiology No results found.  Procedures Procedures (including critical care time)  Medications Ordered in UC Medications - No data to display  Initial Impression / Assessment and Plan / UC Course  I have reviewed the triage vital signs and the nursing notes.  Pertinent labs & imaging results that were available during my care of the patient were reviewed by me and considered in my medical decision making (see chart for details).     1.  Acute respiratory illness secondary to presumed COVID-19 infection: Patient is advised to self quarantine No testing offered Patient is currently on day #4 since onset of symptoms. Patient is educated about CDC guidelines for return to work.  Copy of the CDC guidelines given to patient Patient will call the urgent care on Monday to report her symptoms improvement or otherwise. Tylenol as needed for body aches Zofran for nausea Patient is encouraged to maintain hydration. Hopefully patient will be better and may return to work. Final Clinical Impressions(s) / UC Diagnoses   Final diagnoses:  Respiratory tract infection due to COVID-19 virus   Discharge Instructions   None    ED Prescriptions    Medication Sig Dispense Auth. Provider   ondansetron (ZOFRAN ODT) 4 MG disintegrating tablet Take 1 tablet (4 mg total) by mouth every 8 (eight) hours as needed for nausea or vomiting. 20 tablet Lynise Porr, Britta MccreedyPhilip O, MD   acetaminophen (TYLENOL) 325 MG tablet Take 2 tablets (650 mg total) by mouth every 6 (six) hours as needed.  Merrilee JanskyLamptey, Valora Norell O, MD     Controlled Substance Prescriptions [place] Controlled Substance Registry consulted? No   Merrilee JanskyLamptey, Lizmary Nader O, MD 11/04/18 1502    Merrilee JanskyLamptey, Koty Anctil O, MD 11/04/18 1503    Merrilee JanskyLamptey,  Sya Nestler O, MD 11/08/18 1059

## 2018-11-04 NOTE — ED Triage Notes (Addendum)
Patient presents to Urgent Care with complaints of fever, generalized body aches, and nasal congestion since 4 days ago. Pt states she thinks she may have malaria because she was diagnosed with it prior to coming to the [place] in 2012. Advil taken pta. Tigrinian interpreter used for triage interpretation.

## 2018-11-08 ENCOUNTER — Emergency Department (HOSPITAL_COMMUNITY)
Admission: EM | Admit: 2018-11-08 | Discharge: 2018-11-08 | Disposition: A | Discharge status: Home / Self Care | Payer: Self-pay | Attending: Emergency Medicine | Admitting: Emergency Medicine

## 2018-11-08 ENCOUNTER — Other Ambulatory Visit: Payer: Self-pay

## 2018-11-08 ENCOUNTER — Emergency Department (HOSPITAL_COMMUNITY): Payer: Self-pay

## 2018-11-08 ENCOUNTER — Encounter (HOSPITAL_COMMUNITY): Payer: Self-pay

## 2018-11-08 DIAGNOSIS — R509 Fever, unspecified: Secondary | ICD-10-CM | POA: Insufficient documentation

## 2018-11-08 DIAGNOSIS — F419 Anxiety disorder, unspecified: Secondary | ICD-10-CM | POA: Insufficient documentation

## 2018-11-08 DIAGNOSIS — R6889 Other general symptoms and signs: Secondary | ICD-10-CM

## 2018-11-08 DIAGNOSIS — Z20822 Contact with and (suspected) exposure to covid-19: Secondary | ICD-10-CM

## 2018-11-08 DIAGNOSIS — R110 Nausea: Secondary | ICD-10-CM | POA: Insufficient documentation

## 2018-11-08 DIAGNOSIS — M7918 Myalgia, other site: Secondary | ICD-10-CM | POA: Insufficient documentation

## 2018-11-08 LAB — BASIC METABOLIC PANEL
Anion gap: 8 (ref 5–15)
BUN: 6 mg/dL (ref 6–20)
CO2: 28 mmol/L (ref 22–32)
Calcium: 9.4 mg/dL (ref 8.9–10.3)
Chloride: 105 mmol/L (ref 98–111)
Creatinine, Ser: 0.48 mg/dL (ref 0.44–1.00)
GFR calc Af Amer: >60 mL/min (ref >60)
GFR calc non Af Amer: >60 mL/min (ref >60)
Glucose, Bld: 84 mg/dL (ref 70–99)
Potassium: 3.7 mmol/L (ref 3.5–5.1)
Sodium: 141 mmol/L (ref 135–145)

## 2018-11-08 LAB — CBC
HCT: 40.7 % (ref 36.0–46.0)
Hemoglobin: 13.7 g/dL (ref 12.0–15.0)
MCH: 30 pg (ref 26.0–34.0)
MCHC: 33.7 g/dL (ref 30.0–36.0)
MCV: 89.3 fL (ref 80.0–100.0)
Platelets: 319 10*3/uL (ref 150–400)
RBC: 4.56 MIL/uL (ref 3.87–5.11)
RDW: 11.6 % (ref 11.5–15.5)
WBC: 4.5 10*3/uL (ref 4.0–10.5)
nRBC: 0 % (ref 0.0–0.2)

## 2018-11-08 LAB — I-STAT BETA HCG BLOOD, ED (MC, WL, AP ONLY): I-stat hCG, quantitative: <5 m[IU]/mL (ref <5)

## 2018-11-08 LAB — PARASITE EXAM SCREEN, BLOOD-W CONF TO LABCORP (NOT @ ARMC)

## 2018-11-08 NOTE — ED Notes (Signed)
Pt states she has continued fever and body aches.  Was seen on the 23rd with Covid Sx and sent home to self isolate.  Pt has NAD, resp wnl.  Skin warm and dry.  Interpreter used.

## 2018-11-08 NOTE — ED Triage Notes (Signed)
Interpreter used for triage.  Pt reports 1 week of headache, fever, generalized body aches. Was seen at [facility] on 4/23 for same symptoms. Pt states she thinks she has malaria because she had it in 2012. Pt not feeling any better.

## 2018-11-08 NOTE — Discharge Instructions (Addendum)
As discussed, you likely have COVID, but with consideration of malaria contributing to your illness, test have been sent. You will be made aware of abnormal findings. Please be sure to discuss your test results with your physician if they are abnormal.  Infection, which is more likely, it is important that you stay home, get plenty rest, drink plenty fluids, and return here for concerning changes in your condition.

## 2018-11-08 NOTE — ED Provider Notes (Signed)
[facility] EMERGENCY DEPARTMENT Provider Note   CSN: [id number] Arrival date & time: 11/08/18  1414    History   Chief Complaint Chief Complaint  Patient presents with  . Fever  . Generalized Body Aches    HPI Joanna Ashley is a 33 y.o. female.     HPI  History provided by patient via translator device, video interpreter. Patient notes that she has felt poorly for about 1 week, with diffuse myalgia, subjective fever, worse at night.  There is associated nausea, anorexia, but no focal pain, including chest or abdominal pain.  Patient states that she is generally well, was well prior to the onset of symptoms.  She went to urgent care 3 days ago, was diagnosed with possible novel coronavirus infection, discharged with home care instructions. She notes that since that time she has felt persistently poorly.  On she is specific concern over possible malaria. She has had this in the past, has been [place] within the past year, notes that the symptoms are the same as though she experienced with malaria. Patient also has notable social history of working in a [employer] se[employer]. However, she has not been at work for greater than 1 week, possibly 2. During this illness no relief with anything, including OTC medication  Past Medical History:  Diagnosis Date  . Anxiety     There are no active problems to display for this patient.   History reviewed. No pertinent surgical history.   OB History    Gravida  2   Para  2   Term  2   Preterm      AB      Living  2     SAB      TAB      Ectopic      Multiple      Live Births               Home Medications    Prior to Admission medications   Medication Sig Start Date End Date Taking? Authorizing Provider  acetaminophen (TYLENOL) 325 MG tablet Take 2 tablets (650 mg total) by mouth every 6 (six) hours as needed. 11/04/18   Merrilee Jansky, MD  ondansetron (ZOFRAN ODT) 4 MG disintegrating  tablet Take 1 tablet (4 mg total) by mouth every 8 (eight) hours as needed for nausea or vomiting. 11/04/18   Lamptey, Britta Mccreedy, MD    Family History No family history on file.  Social History Social History   Tobacco Use  . Smoking status: Never Smoker  . Smokeless tobacco: Never Used  Substance Use Topics  . Alcohol use: No  . Drug use: No     Allergies   Patient has no known allergies.   Review of Systems Review of Systems  Constitutional:       Per HPI, otherwise negative  HENT:       Per HPI, otherwise negative  Respiratory:       Per HPI, otherwise negative  Cardiovascular:       Per HPI, otherwise negative  Gastrointestinal: Negative for vomiting.  Endocrine:       Negative aside from HPI  Genitourinary:       Neg aside from HPI   Musculoskeletal:       Per HPI, otherwise negative  Skin: Negative.   Neurological: Positive for weakness. Negative for syncope.     Physical Exam Updated Vital Signs BP 113/69 (BP Location: Right Arm)  Pulse 78   Temp 98 F (36.7 C) (Oral)   Resp 20   LMP 11/07/2018 (Exact Date)   SpO2 98%   Physical Exam Vitals signs and nursing note reviewed.  Constitutional:      General: She is not in acute distress.    Appearance: She is well-developed.  HENT:     Head: Normocephalic and atraumatic.  Eyes:     Conjunctiva/sclera: Conjunctivae normal.  Cardiovascular:     Rate and Rhythm: Normal rate and regular rhythm.  Pulmonary:     Effort: Pulmonary effort is normal. No respiratory distress.     Breath sounds: Normal breath sounds. No stridor.  Abdominal:     General: There is no distension.  Skin:    General: Skin is warm and dry.  Neurological:     Mental Status: She is alert and oriented to person, place, and time.     Cranial Nerves: No cranial nerve deficit.      ED Treatments / Results  Labs (all labs ordered are listed, but only abnormal results are displayed) Labs Reviewed  BASIC METABOLIC PANEL  CBC   URINALYSIS, ROUTINE W REFLEX MICROSCOPIC  PARASITE EXAM SCREEN, BLOOD-W CONF TO [facility] (NOT @ [facility])  I-STAT BETA HCG BLOOD, ED ([facility], [facility], [facility] ONLY)    EKG None  Radiology Dg Chest 2 View  Result Date: 11/08/2018 CLINICAL DATA:  Generalized body aches EXAM: CHEST - 2 VIEW COMPARISON:  01/05/2012 FINDINGS: Mild bilateral interstitial thickening. No pleural effusion or pneumothorax. No focal consolidation. Stable cardiomediastinal silhouette. No aggressive osseous lesion. IMPRESSION: Mild bilateral interstitial thickening which may reflect interstitial infection versus interstitial edema. Electronically Signed   By: Elige KoHetal  Patel   On: 11/08/2018 16:04    Procedures Procedures (including critical care time)  Medications Ordered in ED Medications - No data to display   Initial Impression / Assessment and Plan / ED Course  I have reviewed the triage vital signs and the nursing notes.  Pertinent labs & imaging results that were available during my care of the patient were reviewed by me and considered in my medical decision making (see chart for details).       After the initial evaluation I reviewed the patient's chart including note from urgent care last week, with findings concerning for possible novel coronavirus infection. Here, during the initial evaluation reviewed the patient's labs from today, x-ray, concerning for interstitial thickening. She is awake, alert, in no distress, though there is suspicion for coronavirus, contributing to this illness, there is also consideration of the patient's history of malaria. Patient will be excused from work for 1 week, malaria test sent, and the patient received explicit instructions about coronavirus care, return precautions. However, absent hemodynamic instability, distress, and with anticipated discharge, she does not meet testing criteria.   Joanna Ashley was evaluated in Emergency Department on 11/08/2018 for the symptoms described in the  history of present illness. She was evaluated in the context of the global COVID-19 pandemic, which necessitated consideration that the patient might be at risk for infection with the SARS-CoV-2 virus that causes COVID-19. Institutional protocols and algorithms that pertain to the evaluation of patients at risk for COVID-19 are in a state of rapid change based on information released by regulatory bodies including the CDC and federal and state organizations. These policies and algorithms were followed during the patient's care in the ED.    Final Clinical Impressions(s) / ED Diagnoses   Final diagnoses:  Suspected Covid-19 Virus Infection  Gerhard Munch, MD 11/08/18 [phone]

## 2018-11-09 LAB — PARASITE EXAM, BLOOD

## 2018-11-15 ENCOUNTER — Telehealth (HOSPITAL_COMMUNITY): Payer: Self-pay

## 2018-11-15 ENCOUNTER — Other Ambulatory Visit: Payer: Self-pay

## 2018-11-15 ENCOUNTER — Encounter (HOSPITAL_COMMUNITY): Payer: Self-pay

## 2018-11-15 ENCOUNTER — Ambulatory Visit (HOSPITAL_COMMUNITY)
Admission: EM | Admit: 2018-11-15 | Discharge: 2018-11-15 | Disposition: A | Discharge status: Home / Self Care | Payer: Self-pay | Attending: Family Medicine | Admitting: Family Medicine

## 2018-11-15 DIAGNOSIS — M791 Myalgia, unspecified site: Secondary | ICD-10-CM

## 2018-11-15 DIAGNOSIS — R509 Fever, unspecified: Secondary | ICD-10-CM

## 2018-11-15 DIAGNOSIS — Z20828 Contact with and (suspected) exposure to other viral communicable diseases: Secondary | ICD-10-CM

## 2018-11-15 DIAGNOSIS — R51 Headache: Secondary | ICD-10-CM

## 2018-11-15 MED ORDER — KETOROLAC TROMETHAMINE 30 MG/ML IJ SOLN
30.0000 mg | Freq: Once | INTRAMUSCULAR | Status: AC
  Administered 2018-11-15: 16:00:00 30 mg via INTRAVENOUS

## 2018-11-15 MED ORDER — DEXAMETHASONE SODIUM PHOSPHATE 10 MG/ML IJ SOLN
INTRAMUSCULAR | Status: AC
  Filled 2018-11-15: qty 1

## 2018-11-15 MED ORDER — KETOROLAC TROMETHAMINE 30 MG/ML IJ SOLN
INTRAMUSCULAR | Status: AC
  Filled 2018-11-15: qty 1

## 2018-11-15 MED ORDER — DEXAMETHASONE SODIUM PHOSPHATE 10 MG/ML IJ SOLN
10.0000 mg | Freq: Once | INTRAMUSCULAR | Status: AC
  Administered 2018-11-15: 16:00:00 10 mg via INTRAMUSCULAR

## 2018-11-15 NOTE — Discharge Instructions (Addendum)
Most likely you are still experiencing continued symptoms from suspected COVID Steroid injection and Toradol here in the clinic for symptoms. Work note extension You can continue taking tylenol at home as needed.

## 2018-11-15 NOTE — Telephone Encounter (Signed)
Returned patient's call regarding home medication. Pt verified via name and birthday. Patient called stating the pharmacy did not have prescriptions ready, informed pt that no medications were called in to any pharmacy and that she can continue using Tylenol for her symptoms, which can be purchased wherever medications are sold. Patient verbalized understanding.

## 2018-11-15 NOTE — ED Triage Notes (Signed)
Pt states she has a cold cough, fever, headache and congestion.  Pt states she feels the same from the last time she came here. This has been going on for 3 weeks or more.

## 2018-11-16 NOTE — ED Provider Notes (Signed)
[facility]    CSN: [id number] Arrival date & time: 11/15/18  1341     History   Chief Complaint Chief Complaint  Patient presents with  . Fever    HPI Joanna Ashley is a 33 y.o. female.   Pt is a 33 year old female that presents with continued body aches, headache, fever, weakness. She has been sick for about 3 weeks. She has been seen twice for this and told most likely COVID 19. She was never tested. She was tested for malaria which was negative. She has had some nausea. She has been taking Zofran and tylenol without much relief of her symptoms. She has not worked in 3 weeks. She works at the [employer]. She denies any associated chest pain, SOB, cough, chest congestion. She has had some decrease in appetite. No recent travels.   ROS per HPI      Past Medical History:  Diagnosis Date  . Anxiety     There are no active problems to display for this patient.   History reviewed. No pertinent surgical history.  OB History    Gravida  2   Para  2   Term  2   Preterm      AB      Living  2     SAB      TAB      Ectopic      Multiple      Live Births               Home Medications    Prior to Admission medications   Medication Sig Start Date End Date Taking? Authorizing Provider  acetaminophen (TYLENOL) 325 MG tablet Take 2 tablets (650 mg total) by mouth every 6 (six) hours as needed. Patient not taking: Reported on 11/08/2018 11/04/18   Merrilee Jansky, MD  ondansetron (ZOFRAN ODT) 4 MG disintegrating tablet Take 1 tablet (4 mg total) by mouth every 8 (eight) hours as needed for nausea or vomiting. 11/04/18   Lamptey, Britta Mccreedy, MD    Family History History reviewed. No pertinent family history.  Social History Social History   Tobacco Use  . Smoking status: Never Smoker  . Smokeless tobacco: Never Used  Substance Use Topics  . Alcohol use: No  . Drug use: No     Allergies   Patient has no known allergies.   Review  of Systems Review of Systems   Physical Exam Triage Vital Signs ED Triage Vitals  Enc Vitals Group     BP 11/15/18 1420 133/77     Pulse Rate 11/15/18 1420 75     Resp 11/15/18 1420 18     Temp 11/15/18 1420 97.8 F (36.6 C)     Temp Source 11/15/18 1420 Oral     SpO2 11/15/18 1420 99 %     Weight --      Height --      Head Circumference --      Peak Flow --      Pain Score 11/15/18 1414 8     Pain Loc --      Pain Edu? --      Excl. in GC? --    No data found.  Updated Vital Signs BP 133/77 (BP Location: Right Arm)   Pulse 75   Temp 97.8 F (36.6 C) (Oral)   Resp 18   LMP 11/07/2018 (Exact Date)   SpO2 99%   Visual Acuity Right Eye Distance:  Left Eye Distance:   Bilateral Distance:    Right Eye Near:   Left Eye Near:    Bilateral Near:     Physical Exam Vitals signs and nursing note reviewed.  Constitutional:      Appearance: She is ill-appearing.  HENT:     Head: Normocephalic and atraumatic.     Nose: Nose normal.  Eyes:     Conjunctiva/sclera: Conjunctivae normal.  Neck:     Musculoskeletal: Normal range of motion.  Pulmonary:     Effort: Pulmonary effort is normal.  Musculoskeletal: Normal range of motion.  Skin:    General: Skin is warm and dry.  Neurological:     Mental Status: She is alert.  Psychiatric:        Mood and Affect: Mood normal.      UC Treatments / Results  Labs (all labs ordered are listed, but only abnormal results are displayed) Labs Reviewed - No data to display  EKG None  Radiology No results found.  Procedures Procedures (including critical care time)  Medications Ordered in UC Medications  dexamethasone (DECADRON) injection 10 mg (10 mg Intramuscular Given 11/15/18 1546)  ketorolac (TORADOL) 30 MG/ML injection 30 mg (30 mg Intravenous Given 11/15/18 1546)    Initial Impression / Assessment and Plan / UC Course  I have reviewed the triage vital signs and the nursing notes.  Pertinent labs & imaging  results that were available during my care of the patient were reviewed by me and considered in my medical decision making (see chart for details).    Viral illness.   Most likely pt still having symptoms from some sort of viral illness, possibly COVID.  She is mainly here for work note because she sts that she cannot work Extended current work note and reported she will need to be re seen for every note extension.  Toradol and dexamethasone given for headache No concerning signs or symptoms on exam and her VSS.  She is non toxic or ill appearing.  Follow up as needed for continued or worsening symptoms   Final Clinical Impressions(s) / UC Diagnoses   Final diagnoses:  None     Discharge Instructions     Most likely you are still experiencing continued symptoms from suspected COVID Steroid injection and Toradol here in the clinic for symptoms. Work note extension You can continue taking tylenol at home as needed.      ED Prescriptions    None     Controlled Substance Prescriptions [place] Controlled Substance Registry consulted? Not Applicable   Janace Aris, NP 11/16/18 [phone]

## 2018-12-01 ENCOUNTER — Emergency Department (HOSPITAL_COMMUNITY)
Admission: EM | Admit: 2018-12-01 | Discharge: 2018-12-02 | Disposition: A | Discharge status: Home / Self Care | Payer: HRSA Program | Attending: Emergency Medicine | Admitting: Emergency Medicine

## 2018-12-01 ENCOUNTER — Emergency Department (HOSPITAL_COMMUNITY): Payer: HRSA Program

## 2018-12-01 ENCOUNTER — Encounter (HOSPITAL_COMMUNITY): Payer: Self-pay | Admitting: Emergency Medicine

## 2018-12-01 ENCOUNTER — Other Ambulatory Visit: Payer: Self-pay

## 2018-12-01 DIAGNOSIS — R42 Dizziness and giddiness: Secondary | ICD-10-CM | POA: Insufficient documentation

## 2018-12-01 DIAGNOSIS — R5383 Other fatigue: Secondary | ICD-10-CM | POA: Diagnosis not present

## 2018-12-01 DIAGNOSIS — M7918 Myalgia, other site: Secondary | ICD-10-CM | POA: Diagnosis present

## 2018-12-01 DIAGNOSIS — R0981 Nasal congestion: Secondary | ICD-10-CM | POA: Diagnosis not present

## 2018-12-01 DIAGNOSIS — U071 COVID-19: Secondary | ICD-10-CM | POA: Diagnosis not present

## 2018-12-01 DIAGNOSIS — H9203 Otalgia, bilateral: Secondary | ICD-10-CM | POA: Insufficient documentation

## 2018-12-01 DIAGNOSIS — R51 Headache: Secondary | ICD-10-CM | POA: Insufficient documentation

## 2018-12-01 LAB — URINALYSIS, ROUTINE W REFLEX MICROSCOPIC
Bilirubin Urine: NEGATIVE
Glucose, UA: NEGATIVE mg/dL
Hgb urine dipstick: NEGATIVE
Ketones, ur: NEGATIVE mg/dL
Nitrite: NEGATIVE
Protein, ur: NEGATIVE mg/dL
Specific Gravity, Urine: 1.012 (ref 1.005–1.030)
pH: 7 (ref 5.0–8.0)

## 2018-12-01 LAB — CBC WITH DIFFERENTIAL/PLATELET
Abs Immature Granulocytes: 0.01 10*3/uL (ref 0.00–0.07)
Basophils Absolute: 0 10*3/uL (ref 0.0–0.1)
Basophils Relative: 0 %
Eosinophils Absolute: 0.1 10*3/uL (ref 0.0–0.5)
Eosinophils Relative: 2 %
HCT: 39.4 % (ref 36.0–46.0)
Hemoglobin: 13.1 g/dL (ref 12.0–15.0)
Immature Granulocytes: 0 %
Lymphocytes Relative: 31 %
Lymphs Abs: 2.1 10*3/uL (ref 0.7–4.0)
MCH: 30 pg (ref 26.0–34.0)
MCHC: 33.2 g/dL (ref 30.0–36.0)
MCV: 90.4 fL (ref 80.0–100.0)
Monocytes Absolute: 0.6 10*3/uL (ref 0.1–1.0)
Monocytes Relative: 9 %
Neutro Abs: 3.9 10*3/uL (ref 1.7–7.7)
Neutrophils Relative %: 58 %
Platelets: 351 10*3/uL (ref 150–400)
RBC: 4.36 MIL/uL (ref 3.87–5.11)
RDW: 12.1 % (ref 11.5–15.5)
WBC: 6.8 10*3/uL (ref 4.0–10.5)
nRBC: 0 % (ref 0.0–0.2)

## 2018-12-01 LAB — COMPREHENSIVE METABOLIC PANEL
ALT: 26 U/L (ref 0–44)
AST: 18 U/L (ref 15–41)
Albumin: 3.6 g/dL (ref 3.5–5.0)
Alkaline Phosphatase: 47 U/L (ref 38–126)
Anion gap: 10 (ref 5–15)
BUN: 7 mg/dL (ref 6–20)
CO2: 23 mmol/L (ref 22–32)
Calcium: 9.1 mg/dL (ref 8.9–10.3)
Chloride: 106 mmol/L (ref 98–111)
Creatinine, Ser: 0.45 mg/dL (ref 0.44–1.00)
GFR calc Af Amer: >60 mL/min (ref >60)
GFR calc non Af Amer: >60 mL/min (ref >60)
Glucose, Bld: 104 mg/dL — ABNORMAL HIGH (ref 70–99)
Potassium: 3.3 mmol/L — ABNORMAL LOW (ref 3.5–5.1)
Sodium: 139 mmol/L (ref 135–145)
Total Bilirubin: 0.7 mg/dL (ref 0.3–1.2)
Total Protein: 6.6 g/dL (ref 6.5–8.1)

## 2018-12-01 LAB — GROUP A STREP BY PCR: Group A Strep by PCR: NOT DETECTED

## 2018-12-01 LAB — PREGNANCY, URINE: Preg Test, Ur: NEGATIVE

## 2018-12-01 LAB — SARS CORONAVIRUS 2 BY RT PCR (HOSPITAL ORDER, PERFORMED IN ~~LOC~~ HOSPITAL LAB): SARS Coronavirus 2: POSITIVE — AB

## 2018-12-01 MED ORDER — KETOROLAC TROMETHAMINE 15 MG/ML IJ SOLN
15.0000 mg | Freq: Once | INTRAMUSCULAR | Status: AC
  Administered 2018-12-01: 19:00:00 15 mg via INTRAVENOUS
  Filled 2018-12-01: qty 1

## 2018-12-01 MED ORDER — SODIUM CHLORIDE 0.9 % IV BOLUS (SEPSIS)
1000.0000 mL | Freq: Once | INTRAVENOUS | Status: AC
  Administered 2018-12-01: 19:00:00 1000 mL via INTRAVENOUS

## 2018-12-01 MED ORDER — MECLIZINE HCL 25 MG PO TABS
50.0000 mg | ORAL_TABLET | Freq: Once | ORAL | Status: AC
  Administered 2018-12-01: 19:00:00 50 mg via ORAL
  Filled 2018-12-01: qty 2

## 2018-12-01 NOTE — Discharge Instructions (Addendum)
Thank you for allowing me to care for you today. Please return to the emergency department if you have new or worsening symptoms. Take your medications as instructed.   You may alternate Tylenol 1000 mg every 6 hours as needed for pain and Ibuprofen 800 mg every 8 hours as needed for pain.  Please take Ibuprofen with food.   Steps to find a Primary Care Provider (PCP):  Call [phone] or [phone] to access "[place] Find a Doctor Service."  2.  You may also go on the [facility] website at [email or web address]  3.  [facility] also frequently accepts new patients.  [facility]  [address] [place] [address] [phone]  4.  There are also multiple [facility], [facility] and [facility] practices throughout the [place] that are frequently accepting new patients. You may find a clinic that is close to your home and contact them.  [facility] [email or web address] [phone]  [facility] [email or web address]  [facility] [email or web address] [phone]  [facility] [email or web address] [phone]  5.  Local Health Departments also can provide primary care services.  [facility]  [address] [place] [address] [phone]  [facility] Department [address] [place] [address] [phone]  [facility] Department [address] [place] [address]  [place] [address] [phone]

## 2018-12-01 NOTE — ED Provider Notes (Signed)
Care transferred to me. Still waiting on CT. Care to Dr. Elesa Massed.   Pricilla Loveless, MD 12/01/18 [phone]

## 2018-12-01 NOTE — ED Provider Notes (Signed)
[facility] EMERGENCY DEPARTMENT Provider Note   CSN: [id number] Arrival date & time: 12/01/18  1450    History   Chief Complaint Chief Complaint  Patient presents with   Generalized Body Aches    HPI Joanna Ashley is a 33 y.o. female.     Patient is a 33 year old female with past medical history of anxiety presenting to the emergency department for "I am sick".  This is patient's fifth visit in the past month for similar issues.  Interpreter was used for this patient throughout.  Patient reports that she needs a work note for the next 2 weeks to stay out of work because she does not feel good.  Reports that she has a headache with dizziness and pain in her bilateral ears as well as nasal congestion.  Also reports that she has a sore throat.  Reports that she has had a slight cough at night and in the morning but her biggest concern is her headache.  She also reports fevers off and on and up a fever of 104 at home.  She did not take any medication for this and her temperature was normal here on arrival.  Denies any nausea, vomiting, chest pain, shortness of breath or sick contacts.     Past Medical History:  Diagnosis Date   Anxiety     There are no active problems to display for this patient.   History reviewed. No pertinent surgical history.   OB History    Gravida  2   Para  2   Term  2   Preterm      AB      Living  2     SAB      TAB      Ectopic      Multiple      Live Births               Home Medications    Prior to Admission medications   Medication Sig Start Date End Date Taking? Authorizing Provider  acetaminophen (TYLENOL) 325 MG tablet Take 2 tablets (650 mg total) by mouth every 6 (six) hours as needed. Patient not taking: Reported on 11/08/2018 11/04/18   Merrilee Jansky, MD  ondansetron (ZOFRAN ODT) 4 MG disintegrating tablet Take 1 tablet (4 mg total) by mouth every 8 (eight) hours as needed for nausea or  vomiting. 11/04/18   Lamptey, Britta Mccreedy, MD    Family History No family history on file.  Social History Social History   Tobacco Use   Smoking status: Never Smoker   Smokeless tobacco: Never Used  Substance Use Topics   Alcohol use: No   Drug use: No     Allergies   Patient has no known allergies.   Review of Systems Review of Systems  Constitutional: Positive for activity change, fatigue and fever. Negative for appetite change, chills and diaphoresis.  HENT: Positive for congestion, ear pain, sinus pain and sore throat. Negative for hearing loss and nosebleeds.   Eyes: Negative for visual disturbance.  Respiratory: Positive for cough. Negative for shortness of breath.   Cardiovascular: Negative for chest pain.  Gastrointestinal: Negative for abdominal pain, diarrhea, nausea and vomiting.  Genitourinary: Negative for dysuria.  Musculoskeletal: Negative for arthralgias and back pain.  Skin: Negative for rash.  Allergic/Immunologic: Negative for immunocompromised state.  Neurological: Positive for dizziness and headaches.     Physical Exam Updated Vital Signs BP 97/68    Pulse 76  Temp 98 F (36.7 C) (Oral)    Resp (!) 21    LMP 11/07/2018 (Exact Date)    SpO2 97%   Physical Exam Vitals signs and nursing note reviewed.  Constitutional:      General: She is not in acute distress.    Appearance: Normal appearance. She is not ill-appearing, toxic-appearing or diaphoretic.  HENT:     Head: Normocephalic and atraumatic.     Right Ear: Tympanic membrane normal. There is no impacted cerumen.     Left Ear: Tympanic membrane normal. There is no impacted cerumen.     Nose: Nose normal. No congestion or rhinorrhea.     Mouth/Throat:     Mouth: Mucous membranes are moist.     Pharynx: No oropharyngeal exudate or posterior oropharyngeal erythema.  Eyes:     Extraocular Movements: Extraocular movements intact.     Conjunctiva/sclera: Conjunctivae normal.     Pupils:  Pupils are equal, round, and reactive to light.  Neck:     Musculoskeletal: Normal range of motion.  Cardiovascular:     Rate and Rhythm: Normal rate and regular rhythm.  Pulmonary:     Effort: Pulmonary effort is normal.     Breath sounds: Normal breath sounds.  Abdominal:     General: Abdomen is flat.     Tenderness: There is no abdominal tenderness. There is no guarding.  Skin:    General: Skin is warm.  Neurological:     General: No focal deficit present.     Mental Status: She is alert.  Psychiatric:        Mood and Affect: Mood normal.      ED Treatments / Results  Labs (all labs ordered are listed, but only abnormal results are displayed) Labs Reviewed  SARS CORONAVIRUS 2 (HOSPITAL ORDER, PERFORMED IN [facility]) - Abnormal; Notable for the following components:      Result Value   SARS Coronavirus 2 POSITIVE (*)    All other components within normal limits  COMPREHENSIVE METABOLIC PANEL - Abnormal; Notable for the following components:   Potassium 3.3 (*)    Glucose, Bld 104 (*)    All other components within normal limits  URINALYSIS, ROUTINE W REFLEX MICROSCOPIC - Abnormal; Notable for the following components:   Leukocytes,Ua TRACE (*)    Bacteria, UA RARE (*)    All other components within normal limits  GROUP A STREP BY PCR  CBC WITH DIFFERENTIAL/PLATELET  PREGNANCY, URINE    EKG None  Radiology Ct Head Wo Contrast  Result Date: 12/01/2018 CLINICAL DATA:  Vertigo EXAM: CT HEAD WITHOUT CONTRAST TECHNIQUE: Contiguous axial images were obtained from the base of the skull through the vertex without intravenous contrast. COMPARISON:  CT brain 12/31/2015 FINDINGS: Brain: No evidence of acute infarction, hemorrhage, hydrocephalus, extra-axial collection or mass lesion/mass effect. Vascular: No hyperdense vessel or unexpected calcification. Skull: Normal. Negative for fracture or focal lesion. Sinuses/Orbits: No acute finding. Other: None  IMPRESSION: Negative non contrasted CT appearance of the brain Electronically Signed   By: Jasmine Pang M.D.   On: 12/01/2018 23:23    Procedures Procedures (including critical care time)  Medications Ordered in ED Medications  sodium chloride 0.9 % bolus 1,000 mL (0 mLs Intravenous Stopped 12/01/18 1950)  meclizine (ANTIVERT) tablet 50 mg (50 mg Oral Given 12/01/18 1846)  ketorolac (TORADOL) 15 MG/ML injection 15 mg (15 mg Intravenous Given 12/01/18 1846)     Initial Impression / Assessment and Plan / ED Course  I have reviewed the triage vital signs and the nursing notes.  Pertinent labs & imaging results that were available during my care of the patient were reviewed by me and considered in my medical decision making (see chart for details).  Clinical Course as of Dec 02 1706  Wed Dec 01, 2018  1815 Joanna Ashley was evaluated in Emergency Department on 12/01/2018 for the symptoms described in the history of present illness. She was evaluated in the context of the global COVID-19 pandemic, which necessitated consideration that the patient might be at risk for infection with the SARS-CoV-2 virus that causes COVID-19. Institutional protocols and algorithms that pertain to the evaluation of patients at risk for COVID-19 are in a state of rapid change based on information released by regulatory bodies including the CDC and federal and state organizations. These policies and algorithms were followed during the patient's care in the ED.     [KM]  2048 Patient has COVID positive.  She does not meet admission criteria since her vitals are normal with normal oxygen saturations to 98%.  A head CT was initially ordered since the patient was having an intractable headache and this was her fifth visit in the past month.  Her headache was unrelieved with meclizine and Toradol.  Patient is much improved.  I discussed with the patient the risks and benefits of obtaining CT scan.  Patient insists on obtaining  CT scan.  If CT scan is normal patient may go home.  I discussed with her self quarantine and return precautions.   [KM]    Clinical Course User Index [KM] Arlyn DunningMcLean, Dorian Renfro A, PA-C       Based on review of vitals, medical screening exam, lab work and/or imaging, there does not appear to be an acute, emergent etiology for the patient's symptoms. Counseled pt on good return precautions and encouraged both PCP and ED follow-up as needed.  Prior to discharge, I also discussed incidental imaging findings with patient in detail and advised appropriate, recommended follow-up in detail.  Clinical Impression: 1. COVID-19     Disposition: Discharge  Prior to providing a prescription for a controlled substance, I independently reviewed the patient's recent prescription history on the [place] Controlled Substance Reporting System. The patient had no recent or regular prescriptions and was deemed appropriate for a brief, less than 3 day prescription of narcotic for acute analgesia.  This note was prepared with assistance of Conservation officer, historic buildingsDragon voice recognition software. Occasional wrong-word or sound-a-like substitutions may have occurred due to the inherent limitations of voice recognition software.   Final Clinical Impressions(s) / ED Diagnoses   Final diagnoses:  COVID-19    ED Discharge Orders    None       Jeral PinchMcLean, [place] Natarajan A, PA-C 12/02/18 1708    Geoffery Lyonselo, [place], MD 12/06/18 [phone]

## 2018-12-01 NOTE — ED Provider Notes (Signed)
11:45 PM  Assumed care.  Patient is a 33 year old female who presents to the emergency department with flulike symptoms.  Patient found to be COVID positive.  Hemodynamically stable here with no hypoxia or increased work of breathing.  Complaining of headaches therefore head CT was pending.  Head CT has come back unremarkable.  It seems that headaches are frequent for patient.  She has been here in the past for the same.  No concerns for meningitis per previous providers.  Patient treated symptomatically.  Will discharge with instructions for self quarantine given she is coronavirus positive.  Recommended supportive care and over-the-counter medications.  I reviewed all nursing notes, vitals, pertinent previous records, EKGs, lab and urine results, imaging (as available).    Kamarri Fischetti, Layla Maw, DO 12/01/18 2346

## 2018-12-01 NOTE — ED Triage Notes (Addendum)
Pt to ED with c/o "I'am sick"  Pt has been seen several times for same over past few weeks.  Pt st's she is unable to work.  St's she needs a work note.  Pt's states her fever at home was 104, did not take any meds.  No fever now.  Pt c/o headache. Pt has multiple complaints, headache, fever, abd pain, generalized body pain  Pt requesting to have a work note for 2 more weeks out of work

## 2018-12-02 NOTE — ED Notes (Signed)
Pt discharged from ED; instructions provided; Pt encouraged to return to ED if symptoms worsen and to f/u with PCP; Pt verbalized understanding of all instructions 

## 2018-12-28 ENCOUNTER — Encounter (HOSPITAL_COMMUNITY): Payer: Self-pay | Admitting: *Deleted

## 2018-12-28 ENCOUNTER — Other Ambulatory Visit: Payer: Self-pay

## 2018-12-28 ENCOUNTER — Emergency Department (HOSPITAL_COMMUNITY)
Admission: EM | Admit: 2018-12-28 | Discharge: 2018-12-28 | Disposition: A | Discharge status: Home / Self Care | Payer: Medicaid Other | Attending: Emergency Medicine | Admitting: Emergency Medicine

## 2018-12-28 DIAGNOSIS — R51 Headache: Secondary | ICD-10-CM | POA: Insufficient documentation

## 2018-12-28 DIAGNOSIS — R519 Headache, unspecified: Secondary | ICD-10-CM

## 2018-12-28 DIAGNOSIS — Z79899 Other long term (current) drug therapy: Secondary | ICD-10-CM | POA: Diagnosis not present

## 2018-12-28 LAB — CBC WITH DIFFERENTIAL/PLATELET
Abs Immature Granulocytes: 0.01 10*3/uL (ref 0.00–0.07)
Basophils Absolute: 0 10*3/uL (ref 0.0–0.1)
Basophils Relative: 1 %
Eosinophils Absolute: 0.1 10*3/uL (ref 0.0–0.5)
Eosinophils Relative: 2 %
HCT: 38.3 % (ref 36.0–46.0)
Hemoglobin: 12.7 g/dL (ref 12.0–15.0)
Immature Granulocytes: 0 %
Lymphocytes Relative: 28 %
Lymphs Abs: 2.5 10*3/uL (ref 0.7–4.0)
MCH: 30 pg (ref 26.0–34.0)
MCHC: 33.2 g/dL (ref 30.0–36.0)
MCV: 90.5 fL (ref 80.0–100.0)
Monocytes Absolute: 0.8 10*3/uL (ref 0.1–1.0)
Monocytes Relative: 9 %
Neutro Abs: 5.4 10*3/uL (ref 1.7–7.7)
Neutrophils Relative %: 60 %
Platelets: 370 10*3/uL (ref 150–400)
RBC: 4.23 MIL/uL (ref 3.87–5.11)
RDW: 11.9 % (ref 11.5–15.5)
WBC: 8.8 10*3/uL (ref 4.0–10.5)
nRBC: 0 % (ref 0.0–0.2)

## 2018-12-28 LAB — POC URINE PREG, ED: Preg Test, Ur: NEGATIVE

## 2018-12-28 LAB — CRYPTOCOCCAL ANTIGEN: Crypto Ag: NEGATIVE

## 2018-12-28 LAB — COMPREHENSIVE METABOLIC PANEL
ALT: 16 U/L (ref 0–44)
AST: 17 U/L (ref 15–41)
Albumin: 3.6 g/dL (ref 3.5–5.0)
Alkaline Phosphatase: 44 U/L (ref 38–126)
Anion gap: 7 (ref 5–15)
BUN: 7 mg/dL (ref 6–20)
CO2: 22 mmol/L (ref 22–32)
Calcium: 9 mg/dL (ref 8.9–10.3)
Chloride: 107 mmol/L (ref 98–111)
Creatinine, Ser: 0.45 mg/dL (ref 0.44–1.00)
GFR calc Af Amer: >60 mL/min (ref >60)
GFR calc non Af Amer: >60 mL/min (ref >60)
Glucose, Bld: 96 mg/dL (ref 70–99)
Potassium: 3.6 mmol/L (ref 3.5–5.1)
Sodium: 136 mmol/L (ref 135–145)
Total Bilirubin: 0.5 mg/dL (ref 0.3–1.2)
Total Protein: 6.5 g/dL (ref 6.5–8.1)

## 2018-12-28 LAB — RAPID HIV SCREEN (HIV 1/2 AB+AG)
HIV 1/2 Antibodies: NONREACTIVE
HIV-1 P24 Antigen - HIV24: NONREACTIVE

## 2018-12-28 MED ORDER — SODIUM CHLORIDE 0.9 % IV BOLUS
1000.0000 mL | Freq: Once | INTRAVENOUS | Status: AC
  Administered 2018-12-28: 1000 mL via INTRAVENOUS

## 2018-12-28 MED ORDER — PROCHLORPERAZINE MALEATE 5 MG PO TABS
10.0000 mg | ORAL_TABLET | Freq: Once | ORAL | Status: DC
  Filled 2018-12-28: qty 2

## 2018-12-28 MED ORDER — LIDOCAINE-EPINEPHRINE (PF) 2 %-1:200000 IJ SOLN
10.0000 mL | Freq: Once | INTRAMUSCULAR | Status: DC
  Filled 2018-12-28: qty 20

## 2018-12-28 MED ORDER — DIPHENHYDRAMINE HCL 25 MG PO CAPS
25.0000 mg | ORAL_CAPSULE | Freq: Once | ORAL | Status: DC
  Filled 2018-12-28: qty 1

## 2018-12-28 MED ORDER — METOCLOPRAMIDE HCL 10 MG PO TABS: 10.0000 mg | ORAL_TABLET | Freq: Four times a day (QID) | ORAL | 0 refills | Status: DC

## 2018-12-28 MED ORDER — ACETAMINOPHEN 500 MG PO TABS
1000.0000 mg | ORAL_TABLET | Freq: Once | ORAL | Status: DC
  Filled 2018-12-28: qty 2

## 2018-12-28 NOTE — ED Notes (Signed)
ED Provider at bedside. Via tele interpreter, to discuss plan of care

## 2018-12-28 NOTE — ED Triage Notes (Signed)
PT reports being sick since April 16 . Pt reports  She has been here 5 times for same feeling. Pt reports last worked was may 60. Pt reports working 3rd shift. Pt reports hot in head and not feeling well, Pt reports vomiting some last time vomited at 1400 today.

## 2018-12-28 NOTE — Progress Notes (Signed)
CSW spoke with PA regarding follow-up appointment to community health and wellness. CSW attempted to utilize [facility] for referral and noted it was not listed in the system. CSW notes patient's frequent ED visits and need for primary medical care. CSW will leave information for RN CM.  Lamonte Richer, LCSW, [place] Worker II [phone]

## 2018-12-28 NOTE — ED Provider Notes (Addendum)
[facility]   CSN: [id number] Arrival date & time: 12/28/18  1426     History   Chief Complaint Chief Complaint  Patient presents with  . Headache    HPI Joanna Ashley is a 33 y.o. female.     HPI  Patient is a 33 year old female past medical history of anxiety, malaria infection pain home country of [place], and recent convalescence from COVID-19 infection presenting for recurrent headaches.  Patient reports that she has had daily headaches.  She reports that her headaches began around 10-28-2018.  She reports that she developed more symptoms which prompted the COVID-19  testing such as fever, chills, cough, sore throat.  She was diagnosed with COVID-19 on 12-01-2018.  She remained out of work, and was going back to work however reports that the headaches do become so bad she cannot work.  Patient reports that her headaches are primarily frontal and over the crown.  She describes them as sharp and throbbing.  She does report they are slightly worse in an ambulatory position.  She reports some intermittent vomiting, but this is not regular.  She denies any persistent weakness, numbness, or visual disturbance.  She denies any neck stiffness or tenderness.  Patient reports that she continues to have low-grade fevers today, was 100.0. Denies rigors.   Some historical information was obtained with the assistance of a physician advocate for the patient, Dr. Riley NearingBrhene, who works with refugee population in [place] and is a distant cousin of the patient.  Patient provided consent to discuss medical history in the presence of this physician. He states that he is concerned that the patient had reactivation of malaria.  Patient's last overseas travel was to [place] in June 2019.  Additional information obtained from patient and physical examination performed with assistance of [employer] [place] interpreter.   Past Medical History:  Diagnosis Date  .  Anxiety     There are no active problems to display for this patient.   History reviewed. No pertinent surgical history.   OB History    Gravida  2   Para  2   Term  2   Preterm      AB      Living  2     SAB      TAB      Ectopic      Multiple      Live Births               Home Medications    Prior to Admission medications   Medication Sig Start Date End Date Taking? Authorizing Provider  acetaminophen (TYLENOL) 325 MG tablet Take 2 tablets (650 mg total) by mouth every 6 (six) hours as needed. Patient not taking: Reported on 11/08/2018 11/04/18   Merrilee JanskyLamptey, Philip O, MD  ondansetron (ZOFRAN ODT) 4 MG disintegrating tablet Take 1 tablet (4 mg total) by mouth every 8 (eight) hours as needed for nausea or vomiting. 11/04/18   Lamptey, Britta MccreedyPhilip O, MD    Family History History reviewed. No pertinent family history.  Social History Social History   Tobacco Use  . Smoking status: Never Smoker  . Smokeless tobacco: Never Used  Substance Use Topics  . Alcohol use: No  . Drug use: No     Allergies   Patient has no known allergies.   Review of Systems Review of Systems  Constitutional: Positive for chills and fever.  HENT: Negative for congestion and sore throat.  Eyes: Negative for visual disturbance.  Respiratory: Negative for cough, chest tightness and shortness of breath.   Cardiovascular: Negative for chest pain.  Gastrointestinal: Positive for nausea. Negative for abdominal pain, diarrhea and vomiting.  Genitourinary: Negative for dysuria and flank pain.  Musculoskeletal: Negative for back pain and myalgias.  Skin: Negative for rash.  Neurological: Positive for light-headedness and headaches. Negative for dizziness and syncope.     Physical Exam Updated Vital Signs BP 121/64 (BP Location: Left Arm)   Pulse 77   Temp 98.6 F (37 C) (Oral)   Resp 16   LMP 11/29/2018   SpO2 97%   Physical Exam Vitals signs and nursing note reviewed.   Constitutional:      General: She is not in acute distress.    Appearance: She is well-developed.  HENT:     Head: Normocephalic and atraumatic.  Eyes:     Conjunctiva/sclera: Conjunctivae normal.     Pupils: Pupils are equal, round, and reactive to light.  Neck:     Musculoskeletal: Normal range of motion and neck supple. No neck rigidity.     Comments: No meningismus.  Cardiovascular:     Rate and Rhythm: Normal rate and regular rhythm.     Heart sounds: S1 normal and S2 normal. Murmur present.     Comments: Very soft SEM.  Pulmonary:     Effort: Pulmonary effort is normal.     Breath sounds: Normal breath sounds. No wheezing or rales.  Abdominal:     General: There is no distension.     Palpations: Abdomen is soft.     Tenderness: There is no abdominal tenderness. There is no guarding.  Musculoskeletal: Normal range of motion.        General: No deformity.  Lymphadenopathy:     Cervical: No cervical adenopathy.  Skin:    General: Skin is warm and dry.     Findings: No erythema or rash.  Neurological:     Mental Status: She is alert.     GCS: GCS eye subscore is 4. GCS verbal subscore is 5. GCS motor subscore is 6.     Comments: Mental Status:  Alert, oriented, thought content appropriate, able to give a coherent history. Speech fluent without evidence of aphasia. Able to follow 2 step commands without difficulty.  Cranial Nerves:  II:  Peripheral visual fields grossly normal, pupils equal, round, reactive to light III,IV, VI: ptosis not present, extra-ocular motions intact bilaterally  V,VII: smile symmetric, facial light touch sensation equal VIII: hearing grossly normal to voice  X: uvula elevates symmetrically  XI: bilateral shoulder shrug symmetric and strong XII: midline tongue extension without fassiculations Motor:  Normal tone. 5/5 in upper and lower extremities bilaterally including strong and equal grip strength and dorsiflexion/plantar flexion Sensory:  Pinprick and light touch normal in all extremities.  Cerebellar: normal finger-to-nose with bilateral upper extremities Gait: normal gait and balance, but patient does report ambulating hurts her head.  Stance: No pronator drift and good coordination, strength, and position sense with tapping of bilateral arms (performed in sitting position). CV: distal pulses palpable throughout    Psychiatric:        Behavior: Behavior normal.        Thought Content: Thought content normal.        Judgment: Judgment normal.      ED Treatments / Results  Labs (all labs ordered are listed, but only abnormal results are displayed) Labs Reviewed  POC URINE PREG, ED  EKG    Radiology No results found.  Procedures Procedures (including critical care time)  Medications Ordered in ED Medications  acetaminophen (TYLENOL) tablet 1,000 mg (has no administration in time range)  prochlorperazine (COMPAZINE) tablet 10 mg (has no administration in time range)  diphenhydrAMINE (BENADRYL) capsule 25 mg (has no administration in time range)     Initial Impression / Assessment and Plan / ED Course  I have reviewed the triage vital signs and the nursing notes.  Pertinent labs & imaging results that were available during my care of the patient were reviewed by me and considered in my medical decision making (see chart for details).  Clinical Course as of Dec 27 2149  Tue Dec 28, 2018  1727 Case discussed with attending physician, Dr. Mancel BaleElliott Wentz, and will discuss patient with infectious disease.   [AM]  1748 Spoke with Dr. Drue SecondSnider of ID who states that she recommends rapid HIV (formal if negative) as well as cryptococcal antigen, which will take several days to return. She states she would have low threshold for LP based on clinical appearance looking for cryptococcal meningitis or encephalitis related to COVID-19. Appreciate her involvement.    [AM]  1900 Spoke with Dr. Lynelle DoctorKnapp who assumed care as  attending physician who agrees that LP is reasonable and we will perform in ED.    [AM]  1949 Patient declines LP under any circumstance. The procedure was explained and opportunities for questions were answered. Risks and benefits were explained with [place]igr[place] interpreter.    [AM]  [phone] Spoke with Dr. Drue SecondSnider of ID who recommends no further testing. Appreciate her involvement.    [AM]    Clinical Course User Index [AM] Elisha PonderMurray, Loralyn Rachel B, PA-C       Patient is nontoxic-appearing, afebrile and in no acute distress here in emergency department this is after headache since April 16.  She reports that interfering with work.  She is status post COVID-19 infection that seems to be the onset of her symptoms.  She has a history of headaches but no primary headache disorder and not daily headaches.  Differential diagnosis includes encephalitis, migraine headaches, tension type headaches.  Low suspicion for cranial or intracranial abnormality as patient had a noncontrasted head CT in May which was normal.  Case was discussed with infectious disease, Dr. Ilsa IhaSnyder who recommends rapid HIV testing, cryptococcal antigen testing, and depending on clinical status, will consider LP.  LP was discussed with the patient.  Possibly indicated however she declined this and stated that she did not want to receive a lumbar puncture under any circumstances.  She was offered anxiolysis and the risk and benefits were discussed with the patient.  Given that her HIV test is negative, feel this is reasonable.  A formal HIV outpatient test.  She is instructed to receive a call if it is positive.  Patient definitely needs follow-up with primary care and likely neurology for a possible primary headache disorder if headaches persist.  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 Drue SecondSnider of infectious disease, and as this is a novel disease, there are many different and unknown presentations post COVID-19 that will require chronic  follow-up.  Case management was consulted and spoke with Joe of social work who will leave a message for case management about contacting patient in the morning.  She is given information for the [facility].  An ambulatory referral to neurology was also placed.  Of note, patient requires Tigrinya interpretation.  Patient is been evaluated for  emergent causes of headache today and there are no signs or symptoms of meningeal irritation, intracranial hemorrhage, and with recent noncontrasted head CT, low suspicion for intracranial tumor.  Also doubt pseudotumor cerebri.  Patient was given antiemetic.  Return precautions given for any increasing pain, visual disturbance, difficulty walking, speaking, or new or worsening symptoms with her headaches.  All questions answered with assistance of [employer] [place] interpreter. Patient is in understanding and agrees with the plan of care.  Final Clinical Impressions(s) / ED Diagnoses   Final diagnoses:  Frontal headache    ED Discharge Orders         Ordered    Ambulatory referral to Neurology    Comments: An appointment is requested in approximately: 1 week   12/28/18 2137    metoCLOPramide (REGLAN) 10 MG tablet  Every 6 hours     12/28/18 2137            Delia ChimesMurray, Detrice Cales B, PA-C 12/29/18 1254    Linwood DibblesKnapp, Jon, MD 12/31/18 1035

## 2018-12-28 NOTE — Discharge Instructions (Signed)
You are prescribed Reglan for nausea.  You may take this medication every 6 hours as needed for nausea and vomiting.  Recommend taking it with 1 pill of Benadryl.  This will counteract any side effects of feeling anxious.  Your lab work is normal and very reassuring today.  The HIV test was negative as well as the cryptococcal antigen which is the blood test for an infection in the fluid around the brain.  You were tested for malaria on April 27, and the result was negative.  I have referred you to primary care at [facility].  This clinic should be reaching out to you in the next couple days.  I have also placed a referral to [facility] neurolog[facility] associates.  Their numbers on the front page of your paperwork.  Please come back to the emergency department if you develop any worsening pain, vision changes, changes in speech, ability to walk, or new or worsening symptoms.  Thank you for allowing [place] to participate in your care today.

## 2018-12-28 NOTE — ED Notes (Signed)
Provider remains in room

## 2018-12-29 ENCOUNTER — Telehealth: Payer: Self-pay | Admitting: *Deleted

## 2018-12-29 LAB — HIV ANTIBODY (ROUTINE TESTING W REFLEX): HIV Screen 4th Generation wRfx: NONREACTIVE

## 2018-12-29 NOTE — Telephone Encounter (Signed)
EDCM following up on consult. LVM for pt to follow up with PCP listed on Medicaid Card.

## 2018-12-30 ENCOUNTER — Telehealth: Payer: Self-pay | Admitting: Surgery

## 2018-12-30 NOTE — Telephone Encounter (Signed)
ED CM received call from patient concerning assistance with ED follow up. Patient was seen 6/17 in the ED and was told that CM will contact her to assist with this. Patient called after hours CM will reach out to clinic CM with this information to contact patient to set up ED follow up.

## 2019-01-12 ENCOUNTER — Telehealth: Payer: Self-pay

## 2019-01-12 NOTE — Telephone Encounter (Signed)
Message received from Laurena Slimmer, RN CM requesting a hospital follow up appointment for the patient at [facility].  Call placed to patient with assistance of [place] interpreter # [phone] with [employer].  The patient has an appointment with neurology on 02/08/2019 and would like to wait until after then to schedule follow up at [facility].  She requested that the [facility] address and phone number be text to her # [phone].Marland Kitchen the information was text to her as requested.     She also noted that she has medical bills from the hospital and has not been able to reach financial counseling at the hospital.  Provided her with the main number for [facility] and instructed her to try that number.   Update provided to Wendi Maya, RN CM

## 2019-02-08 ENCOUNTER — Telehealth: Payer: Self-pay | Admitting: Neurology

## 2019-02-08 ENCOUNTER — Other Ambulatory Visit: Payer: Self-pay

## 2019-02-08 ENCOUNTER — Ambulatory Visit (INDEPENDENT_AMBULATORY_CARE_PROVIDER_SITE_OTHER): Payer: Medicaid Other | Admitting: Neurology

## 2019-02-08 ENCOUNTER — Encounter: Payer: Self-pay | Admitting: Neurology

## 2019-02-08 VITALS — BP 130/78 | HR 77 | Temp 98.0°F | Ht 65.0 in | Wt 204.0 lb

## 2019-02-08 DIAGNOSIS — G4719 Other hypersomnia: Secondary | ICD-10-CM | POA: Diagnosis not present

## 2019-02-08 DIAGNOSIS — R51 Headache: Secondary | ICD-10-CM

## 2019-02-08 DIAGNOSIS — G4489 Other headache syndrome: Secondary | ICD-10-CM | POA: Diagnosis not present

## 2019-02-08 DIAGNOSIS — R519 Headache, unspecified: Secondary | ICD-10-CM

## 2019-02-08 NOTE — Telephone Encounter (Signed)
medicaid order sent to GI. They will obtain the auth and reach out to the patient to schedule.  °

## 2019-02-08 NOTE — Progress Notes (Signed)
Subjective:    Patient ID: Joanna Ashley is a 33 y.o. female.  HPI     Joanna FoleySaima Rockne Dearinger, MD, PhD [facility] [address], [address] [address] [place], [place] [id number]  I saw patient Joanna Ashley as a referral from the emergency room.  Patient is accompanied by KG, from interpretation services ([place]iny[place]) today.  She is a 33 year old female with an underlying medical history of anxiety and mild obesity, who reports recurrent headaches since mid April.  She presented to the emergency room on 12/28/2018 with recurrent headaches.  She has a history of headaches for the past few years.  She was recently diagnosed with COVID-19 in May 2020, by nasopharyngeal swab.  She had additional blood work for cryptococcal antigen and HIV testing, both negative, CMP and CBC were unremarkable as well.  She was negative for a point-of-care urine pregnancy test and prior to that she had a negative strep A test in May.  She declined a lumbar puncture in the emergency room.  She had a head CT recently on 12/01/2018 without contrast and I reviewed the results: IMPRESSION: Negative non contrasted CT appearance of the brain.  She was treated symptomatically in the emergency room with Tylenol, Compazine and Benadryl. She had a head CT without contrast with indication of headache, on 12/31/2015 and I reviewed the results: IMPRESSION: Normal exam.   She reports no history of prior headaches, no history of migraines, no family history of migraines.  She has felt hot like a fever but on the outside she does not actually have a fever.  She reports no snoring and sleeps fairly well but has woken up with a headache.  She has occasional nocturia.  She denies any recent stress.  She has taken Advil which increases her sweating.  She does not have a primary care physician and has not had a recent checkup.  She is a non-smoker, does not drink alcohol, drinks caffeine in the form of coffee, 1 cup/day and tea, 1 cup/day on  average.  She hydrates well, averages about 3 bottles of water per day and some juice.  She goes to bed between 9 and 11, rise time around 7.  She reports that the headache is non-throbbing, it feels like a pressure in the back of her head and squeezing sensation in the front of her head, bilateral, no associated nausea, no vomiting, no photo phobia, no sonophobia.  She has had no neurological accompaniment, in particular, no associated one-sided weakness, numbness, tingling, droopy face or slurring of speech.  She lives with her 33 year old son.  [place]. Her Epworth sleepiness score is 12 out of 24, fatigue severity score is 63 out of 63.She has not had a recent eye examination.  She does not have prescription eyeglasses.  She denies any visual symptoms, in particular, no blurry vision or double vision or loss of vision.  Her Past Medical History Is Significant For: Past Medical History:  Diagnosis Date  . Anxiety     Her Past Surgical History Is Significant For: No past surgical history on file.  Her Family History Is Significant For: No family history on file.  Her Social History Is Significant For: Social History   Socioeconomic History  . Marital status: Single    Spouse name: Not on file  . Number of children: Not on file  . Years of education: Not on file  . Highest education level: Not on file  Occupational History  .  Not on file  Social Needs  . Financial resource strain: Not on file  . Food insecurity    Worry: Not on file    Inability: Not on file  . Transportation needs    Medical: Not on file    Non-medical: Not on file  Tobacco Use  . Smoking status: Never Smoker  . Smokeless tobacco: Never Used  Substance and Sexual Activity  . Alcohol use: No  . Drug use: No  . Sexual activity: Not Currently  Lifestyle  . Physical activity    Days per week: Not on file    Minutes per session: Not on file  . Stress: Not on file  Relationships  .  Social Musicianconnections    Talks on phone: Not on file    Gets together: Not on file    Attends religious service: Not on file    Active member of club or organization: Not on file    Attends meetings of clubs or organizations: Not on file    Relationship status: Not on file  Other Topics Concern  . Not on file  Social History Narrative   ** Merged History Encounter **        Her Allergies Are:  Allergies  Allergen Reactions  . Other     Pt reports that she is allergic to shots given to her in the hospital but cannot name any medications.  :   Her Current Medications Are:  Outpatient Encounter Medications as of 02/08/2019  Medication Sig  . [DISCONTINUED] acetaminophen (TYLENOL) 325 MG tablet Take 2 tablets (650 mg total) by mouth every 6 (six) hours as needed. (Patient not taking: Reported on 11/08/2018)  . [DISCONTINUED] metoCLOPramide (REGLAN) 10 MG tablet Take 1 tablet (10 mg total) by mouth every 6 (six) hours for 7 days.  . [DISCONTINUED] ondansetron (ZOFRAN ODT) 4 MG disintegrating tablet Take 1 tablet (4 mg total) by mouth every 8 (eight) hours as needed for nausea or vomiting.   No facility-administered encounter medications on file as of 02/08/2019.   :   Review of Systems:  Out of a complete 14 point review of systems, all are reviewed and negative with the exception of these symptoms as listed below:  Review of Systems  Neurological:       Pt presents today to discuss her headaches. She has daily headaches. She wakes up with a headache. She reports that she has never had a sleep study and does not endorse snoring.  Epworth Sleepiness Scale 0= would never doze 1= slight chance of dozing 2= moderate chance of dozing 3= high chance of dozing  Sitting and reading: 2 Watching TV: 1 Sitting inactive in a public place (ex. Theater or meeting): 2 As a passenger in a car for an hour without a break: 3 Lying down to rest in the afternoon: 1 Sitting and talking to someone:  1 Sitting quietly after lunch (no alcohol): 1 In a car, while stopped in traffic: 1 Total: 12     Objective:  Neurological Exam  Physical Exam Physical Examination:   Vitals:   02/08/19 1017  BP: 130/78  Pulse: 77  Temp: 98 F (36.7 C)    General Examination: The patient is a very pleasant 33 y.o. female in no acute distress. She appears well-developed and well-nourished and well groomed.   HEENT: Normocephalic, atraumatic, pupils are equal, round and reactive to light and accommodation. Funduscopic exam is normal with sharp disc margins noted. Extraocular tracking is good without  limitation to gaze excursion or nystagmus noted. Normal smooth pursuit is noted. Hearing is grossly intact. Face is symmetric with normal facial animation and normal facial sensation. Speech is clear with no dysarthria noted. There is no hypophonia. There is no lip, neck/head, jaw or voice tremor. Neck is supple with full range of passive and active motion. There are no carotid bruits on auscultation. Oropharynx exam reveals: moderate mouth dryness, good dental hygiene and mild airway crowding, due to Small airway entry, tonsils of 1+ bilaterally.  Tongue protrudes centrally in palate elevates symmetrically.  Mallampati is class III.  Chest: Clear to auscultation without wheezing, rhonchi or crackles noted.  Heart: S1+S2+0, regular and normal without murmurs, rubs or gallops noted.   Abdomen: Soft, non-tender and non-distended with normal bowel sounds appreciated on auscultation.  Extremities: There is no pitting edema in the distal lower extremities bilaterally.   Skin: Warm and dry without trophic changes noted.  Musculoskeletal: exam reveals no obvious joint deformities, tenderness or joint swelling or erythema.   Neurologically:  Mental status: The patient is awake, alert and oriented in all 4 spheres. Her immediate and remote memory, attention, language skills and fund of knowledge are  appropriate. There is no evidence of aphasia, agnosia, apraxia or anomia. Speech is clear with normal prosody and enunciation. Thought process is linear. Mood is normal and affect is normal.  Cranial nerves II - XII are as described above under HEENT exam. In addition: shoulder shrug is normal with equal shoulder height noted. Motor exam: Normal bulk, strength and tone is noted. There is no drift, tremor or rebound. Romberg is negative. Reflexes are 2+ throughout. Babinski: Toes are flexor bilaterally. Fine motor skills and coordination: intact with normal finger taps, normal hand movements, normal rapid alternating patting, normal foot taps and normal foot agility.  Cerebellar testing: No dysmetria or intention tremor on finger to nose testing. Heel to shin is unremarkable bilaterally. There is no truncal or gait ataxia.  Sensory exam: intact to light touch in the upper and lower extremities.  Gait, station and balance: She stands easily. No veering to one side is noted. No leaning to one side is noted. Posture is age-appropriate and stance is narrow based. Gait shows normal stride length and normal pace. No problems turning are noted. Tandem walk is unremarkable.     Assessment and Plan:   In summary, Anajah Head is a very pleasant 33 y.o.-year old female with an underlying medical history of anxiety and mild obesity, who Presents for evaluation of her recurrent headaches.  She describes bilateral non-throbbing headaches, no significant nausea, vomiting, photophobia, sonophobia or new neurological accompaniments thankfully.  She has had headaches for the past 3 months.  Her history and examination are not in keeping with migraines.  Reassuringly, her neurological exam is nonfocal.  She is advised that recurrent headaches can be seen in the context of other systemic illnesses including blood pressure fluctuations, stress, inflammation, infection.  She was recently seen in the emergency room and was also  recently diagnosed with COVID-19 in May 2020.  I would like to suggest further work-up for the cause of her headaches.  She is advised to proceed with a brain MRI with and without contrast.  In addition, we will look at blood work since she has not had any recent comprehensive blood work with the exception of what was tested in the emergency room recently.We talked about headache triggers.  She is strongly advised to stay well-hydrated and well rested.  She is advised that sleep apnea can be a cause for recurrent and new onset headaches.  I would recommend a sleep study as well.  As far as the blood work, we will look for inflammatory markers and autoimmune markers.  We will call her with her test results.  She is strongly advised to follow-up and establish care with a new primary care provider.  She currently does not have a family physician or internal medicine doctor. I did give her some suggestions in that regard.  She was given written instructions.  I suggest that she continue with as needed use of Advil or Tylenol for her headaches for now and we will keep her posted as to her test results. I answered all her questions today and the patient was in agreement. I plan to see her back after her tests.    Joanna FoleySaima Marnae Madani, MD, PhD

## 2019-02-08 NOTE — Patient Instructions (Addendum)
Your headaches do not sound like migraines.  Please continue with as needed Advil or Tylenol for now.  Headaches can be seen with a viral infection, inflammation.  Headaches could be from blood pressure fluctuation.  You other symptom of feeling hot can be seen in the context of an infection, inflammation, or thyroid disease for example.I think it is very important for you to see a primary care physician.  Please follow-up as soon as possible.  You can find a primary care physician in your area.  Local practices in the [place] area including [place], live our primary care, tried internal medicine, Careers information officer and [employer] for example.   Your neurological exam is normal.   We will investigate your headaches further in the form of brain MRI with and without contrast, we will also do blood work to look for thyroid disease, inflammatory and autoimmune markers, and I would recommend a sleep study to rule out sleep apnea as an underlying possible cause for recurrent headaches.  We will call you with your test results by phone and follow up after testing.

## 2019-02-09 LAB — C-REACTIVE PROTEIN: CRP: 4 mg/L (ref 0–10)

## 2019-02-09 LAB — VITAMIN D 25 HYDROXY (VIT D DEFICIENCY, FRACTURES): Vit D, 25-Hydroxy: 12.5 ng/mL — ABNORMAL LOW (ref 30.0–100.0)

## 2019-02-09 LAB — ANA W/REFLEX: Anti Nuclear Antibody (ANA): NEGATIVE

## 2019-02-09 LAB — RHEUMATOID FACTOR: Rheumatoid fact SerPl-aCnc: <10 IU/mL (ref 0.0–13.9)

## 2019-02-09 LAB — HGB A1C W/O EAG: Hgb A1c MFr Bld: 5 % (ref 4.8–5.6)

## 2019-02-09 LAB — RPR: RPR Ser Ql: NONREACTIVE

## 2019-02-09 LAB — SEDIMENTATION RATE: Sed Rate: 7 mm/hr (ref 0–32)

## 2019-02-09 LAB — TSH: TSH: 1.71 u[IU]/mL (ref 0.450–4.500)

## 2019-02-09 NOTE — Progress Notes (Signed)
Labs are normal with the exception of Vitamin D level low at 12.5 (normal range is around 30-100). I would recommend that patient start an OTC Vitamin D supplement: 2000 to 5000 units daily of any vitamin D supplement of Her choice should be fine. I would recommend recheck of vitamin D status in 3 months with PCP.  Please call pt. She indicated we can call her, not sure if we need ph interpreter for [place] language. Michel Bickers

## 2019-02-10 ENCOUNTER — Telehealth: Payer: Self-pay

## 2019-02-10 NOTE — Telephone Encounter (Signed)
-----   Message from [name], MD sent at 02/09/2019  4:43 PM EDT ----- Labs are normal with the exception of Vitamin D level low at 12.5 (normal range is around 30-100). I would recommend that patient start an OTC Vitamin D supplement: 2000 to 5000 units daily of any vitamin D supplement of Her choice should be fine. I would recommend recheck of vitamin D status in 3 months with PCP.  Please call pt. She indicated we can call her, not sure if we need ph interpreter for [place] language. Michel Bickers

## 2019-02-10 NOTE — Telephone Encounter (Signed)
I called pt, spent 20 minutes, used [place] interpreter [place] [id number], discussed her lab results and recommendations extensively.  Apparently, the phone number provided by the pt is the wrong number, it is her friend's number, and she now wants that phone number removed from her chart.  Pt asked for a letter excusing her from work until her headaches are resolved. I advised her that Dr. Rexene Alberts agreed to a work note yesterday but cannot write any further work notes at this time.  I reminded pt the importance of having a PCP. A PCP might be able to help her with work notes. She reports that she was never given a referral to PCP. I advised her that a referral is not needed, she can call the offices listed on her AVS. She reports that she was not given an AVS. I reminded her that this is the paperwork that I personally gave her at the end of the visit yesterday.  Pt asked that I provide her phone numbers to a PCP office, I gave her the number to [facility].  Pt was agreeable to the recommendations and verbalized understanding.

## 2019-02-10 NOTE — Telephone Encounter (Signed)
Thank you for explaining this again to the patient. With the help of the interpreter yesterday, I had made some suggestions as to different primary care groups in and around town, she was given some names in writing.   She was advised at the time that she does not need a referral for primary care physicians.

## 2019-02-10 NOTE — Telephone Encounter (Signed)
I called pt to discuss her lab work. No answer, left a message asking her to call me back. 

## 2019-02-10 NOTE — Telephone Encounter (Signed)
Pt was returning a call to the nurse

## 2019-02-25 ENCOUNTER — Telehealth: Payer: Self-pay | Admitting: General Practice

## 2019-02-25 NOTE — Telephone Encounter (Signed)
Called to give the patient an appointment lvm for her to call [phone] or [phone]

## 2019-02-25 NOTE — Telephone Encounter (Signed)
-----   Message from Eden Lathe, RN sent at 01/04/2019 10:36 AM EDT ----- Regarding: FW: F/U appt This patient needs an interpreter. She will need to be televisit unless provider determines she needs to be in person  thanks ----- Message ----- From: Laurena Slimmer, RN Sent: 12/30/2018  11:29 PM EDT To: Eden Lathe, RN Subject: F/U appt                                        Hi Jane,  I have another patient who was seen in the ED and the EDP noted  some incidental findings on the CT scan.  She does not speak English she speaks a dialect called "Tigrinian".  It was noted that She tested positive for Covid-19 back in May just to be aware.  She was given the information as well to contact the clinic. She has a friend that translate.  Thanks Christene Lye    Laurena Slimmer RN BSN CNOR ED Case Manager Case Management Department [facility] [address] [place]. [place] Ph. [phone] Ph [phone] Fax: [phone] [email or web address]

## 2019-02-28 ENCOUNTER — Telehealth: Payer: Self-pay

## 2019-02-28 NOTE — Telephone Encounter (Signed)
We have attempted to call the patient two times to schedule sleep study.  Patient has been unavailable at the phone numbers we have on file and has not returned our calls. If patient calls back we will schedule them for their sleep study.  

## 2019-03-07 ENCOUNTER — Inpatient Hospital Stay: Admission: RE | Admit: 2019-03-07 | Payer: Medicaid Other | Source: Ambulatory Visit

## 2019-03-16 ENCOUNTER — Encounter: Payer: Self-pay | Admitting: Family Medicine

## 2019-03-16 ENCOUNTER — Other Ambulatory Visit: Payer: Self-pay

## 2019-03-16 ENCOUNTER — Ambulatory Visit: Payer: Medicaid Other | Admitting: Family Medicine

## 2019-03-16 DIAGNOSIS — E559 Vitamin D deficiency, unspecified: Secondary | ICD-10-CM

## 2019-03-16 DIAGNOSIS — R51 Headache: Secondary | ICD-10-CM | POA: Diagnosis not present

## 2019-03-16 DIAGNOSIS — Z8619 Personal history of other infectious and parasitic diseases: Secondary | ICD-10-CM

## 2019-03-16 DIAGNOSIS — R519 Headache, unspecified: Secondary | ICD-10-CM

## 2019-03-16 DIAGNOSIS — U071 COVID-19: Secondary | ICD-10-CM

## 2019-03-16 DIAGNOSIS — Z7689 Persons encountering health services in other specified circumstances: Secondary | ICD-10-CM

## 2019-03-16 NOTE — Progress Notes (Signed)
   Subjective:    Patient ID: Joanna Ashley, female    DOB: July 08, 1986, 33 y.o.   MRN: [id number]   CC: Establish care   HPI: Joanna Ashley is a 33 yo [id number] female presenting to establish care:   Care team:  Neurologist: Dr. Rexene Alberts at [facility]  Past medical history:   Bilateral Frontal Headaches: Following with Neurology. Scheduled for sleep study and MRI brain at the end of September.  CT head unremarkable in 12/2018.  She continues to struggle with this, feels like she is having headaches almost daily for a few hours at a time.  Feels like it is sometimes at her temples and other times like a tension band across her forehead.  Denies any photophobia, phonophobia.  Sometimes will feel nauseous with it.  Taking some Tylenol intermittently, will help for a little bit.  COVID 19 infection in 11/2018, resolved  Vitamin D deficiency, level 12.5 on 7/28 >> will need to recheck in about 3 months  Denies any further known past medical history  Social history: Works at [employer],  [employer]. Has one son, 73. Does not exercise regularly but tries to stay active. Lifetime non-smoker, does not drink alcohol. Enjoys watching movies for fun. Family lives in [place], however she has been in the [place] for 8 years.   Family history: Denies any significant history.  Meds: Vitamin D, tylenol as needed.   Health: Due to T-Dap, papsmear, flu shot (declines flu shot today)  In person [place] interpreter present for the duration of the visit.   Review of Systems Per HPI, denies any fever, chest pain, shortness of breath, vomiting, abdominal pains, weakness, numbness, syncopal events  Objective:  BP 110/60   Ht 5\' 5"  (1.651 m)   Wt 194 lb 2 oz (88.1 kg)   LMP 03/08/2019   BMI 32.30 kg/m  Vitals and nursing note reviewed  General: NAD, pleasant HEENT: Mucous membranes moist, no anterior/posterior cervical lymphadenopathy Cardiac: RRR, normal heart sounds, no murmurs  Respiratory: CTAB, normal effort Abdomen: soft, nontender, nondistended Extremities: no edema or cyanosis. WWP.  Noted tense bilateral trapezius muscles and posterior cervical neck musculature, no specific trigger points. Skin: warm and dry, no rashes noted Neuro: alert and oriented, CN II-XII intact, 5/5 upper and lower extremity strength.  Rapid hand movement intact.  Sensation to light touch intact throughout.  EOMI, PERRLA. Psych: normal affect  Assessment & Plan:   Bilateral headache Reports daily since 10/2018, bilateral frontal and throbbing in nature.  Neurologically intact on exam.  Already being followed by neurologist who is currently pursuing further work-up including sleep study and MRI brain.  Headaches not felt to be consistent with migraines during their evaluation or today. Suspect these are likely multifactorial, could also consider precipitating MSK involvement given tense musculature on exam. - Continue follow-up/management per neurology - Recommended heat as needed, Tylenol up to 4 g daily - Range of motion exercises, massage - Return/ED precautions discussed  Vitamin D deficiency Level 12.5 in 01/2019.  Started on supplementation. - Recheck vitamin D level in approximately 2 months  Establishing care with new doctor, encounter for Reviewed and discussed past medical, social, surgical, and family history.  Additionally went over overdue health maintenance. - Declines flu shot at this time - Will follow-up to have Pap smear completed   Follow-up as soon as convenient for Pap smear, or sooner if needed  [place] Resident PGY-2

## 2019-03-16 NOTE — Patient Instructions (Signed)
Wonderful to meet you! For your headaches, make sure to watch your posture, you can use ice/heat on your shoulders and/or forehead for 20 min at a time, and use tylenol (3-4g daily) or ibuprofen as needed. Please follow up with your neurologist.   I would like you to return for your papsmear and to discuss headaches further as needed at your convenience.

## 2019-03-18 ENCOUNTER — Encounter: Payer: Self-pay | Admitting: Family Medicine

## 2019-03-18 DIAGNOSIS — Z7689 Persons encountering health services in other specified circumstances: Secondary | ICD-10-CM | POA: Insufficient documentation

## 2019-03-18 DIAGNOSIS — U071 COVID-19: Secondary | ICD-10-CM | POA: Insufficient documentation

## 2019-03-18 DIAGNOSIS — R519 Headache, unspecified: Secondary | ICD-10-CM | POA: Insufficient documentation

## 2019-03-18 DIAGNOSIS — E559 Vitamin D deficiency, unspecified: Secondary | ICD-10-CM | POA: Insufficient documentation

## 2019-03-18 NOTE — Assessment & Plan Note (Signed)
Reviewed and discussed past medical, social, surgical, and family history.  Additionally went over overdue health maintenance. - Declines flu shot at this time - Will follow-up to have Pap smear completed

## 2019-03-18 NOTE — Assessment & Plan Note (Signed)
Level 12.5 in 01/2019.  Started on supplementation. - Recheck vitamin D level in approximately 2 months

## 2019-03-18 NOTE — Assessment & Plan Note (Signed)
Reports daily since 10/2018, bilateral frontal and throbbing in nature.  Neurologically intact on exam.  Already being followed by neurologist who is currently pursuing further work-up including sleep study and MRI brain.  Headaches not felt to be consistent with migraines during their evaluation or today. Suspect these are likely multifactorial, could also consider precipitating MSK involvement given tense musculature on exam. - Continue follow-up/management per neurology - Recommended heat as needed, Tylenol up to 4 g daily - Range of motion exercises, massage - Return/ED precautions discussed

## 2019-04-06 ENCOUNTER — Other Ambulatory Visit: Payer: Self-pay

## 2019-04-06 ENCOUNTER — Ambulatory Visit
Admission: RE | Admit: 2019-04-06 | Discharge: 2019-04-06 | Disposition: A | Discharge status: Home / Self Care | Payer: Medicaid Other | Source: Ambulatory Visit | Attending: Neurology | Admitting: Neurology

## 2019-04-06 DIAGNOSIS — G4489 Other headache syndrome: Secondary | ICD-10-CM | POA: Diagnosis not present

## 2019-04-06 DIAGNOSIS — R51 Headache: Secondary | ICD-10-CM | POA: Diagnosis not present

## 2019-04-06 DIAGNOSIS — R519 Headache, unspecified: Secondary | ICD-10-CM

## 2019-04-06 DIAGNOSIS — G4719 Other hypersomnia: Secondary | ICD-10-CM

## 2019-04-06 MED ORDER — GADOBENATE DIMEGLUMINE 529 MG/ML IV SOLN
17.0000 mL | Freq: Once | INTRAVENOUS | Status: AC | PRN
  Administered 2019-04-06: 17 mL via INTRAVENOUS

## 2019-04-08 ENCOUNTER — Telehealth: Payer: Self-pay | Admitting: Family Medicine

## 2019-04-11 ENCOUNTER — Telehealth: Payer: Self-pay

## 2019-04-11 NOTE — Telephone Encounter (Signed)
-----   Message from [name], MD sent at 04/11/2019  8:27 AM EDT ----- MRI brain was reported as normal, which is of course reassuring. Proceed with sleep study, as discussed. Please notify patient.  Joanna Ashley

## 2019-04-11 NOTE — Telephone Encounter (Signed)
I called pt, using [place] interpreter [phone], no answer, left a message asking her to call me back.

## 2019-04-11 NOTE — Progress Notes (Signed)
MRI brain was reported as normal, which is of course reassuring. Proceed with sleep study, as discussed. Please notify patient.  Michel Bickers

## 2019-04-18 NOTE — Telephone Encounter (Signed)
I called pt again to discuss. No answer, left a message using a tigrinian interpreter [phone] to call us back.

## 2019-04-26 NOTE — Telephone Encounter (Signed)
I called pt again to discuss her MRI results using [place] interpreter [phone]. No answer, left a message asking her to call me back. This is my third unsuccessful attempt at reaching the pt by phone. Will send a letter asking her to call me back.

## 2019-05-11 ENCOUNTER — Ambulatory Visit: Payer: Medicaid Other | Admitting: Neurology

## 2019-05-18 ENCOUNTER — Ambulatory Visit: Payer: Medicaid Other | Admitting: Neurology

## 2019-05-18 NOTE — Telephone Encounter (Signed)
Pt returning call please call back °

## 2019-05-18 NOTE — Telephone Encounter (Signed)
I called pt, using [place] interpreter [phone], and spent 15 minutes on the phone with her discussing her MRI results and answering her questions. She declines a sleep study and says that sleep was not discussed at her appointment. She has multiple questions about her upcoming appt and our office. She indicated that she was coming to the appt next week. Pt verbalized understanding of results. Pt had no questions at this time but was encouraged to call back if questions arise.

## 2019-05-24 ENCOUNTER — Telehealth: Payer: Self-pay | Admitting: Neurology

## 2019-05-24 ENCOUNTER — Encounter: Payer: Self-pay | Admitting: Neurology

## 2019-05-24 ENCOUNTER — Ambulatory Visit: Payer: Medicaid Other | Admitting: Neurology

## 2019-05-24 ENCOUNTER — Telehealth: Payer: Self-pay

## 2019-05-24 NOTE — Telephone Encounter (Signed)
Noted, thanks!

## 2019-05-24 NOTE — Telephone Encounter (Signed)
Pt did not show for their appt with Dr. Rexene Alberts today. Pt also cancelled within 24 hours on 05/11/2019.

## 2019-05-24 NOTE — Telephone Encounter (Signed)
Phone rep checked office voicemail, pt left voicemail at 10:59 this morning stating she would not make appointment today.  A voicemail was left for pt to call [facility] back to r/s her appointment.

## 2019-06-23 ENCOUNTER — Ambulatory Visit: Payer: Medicaid Other | Admitting: Neurology

## 2019-06-27 ENCOUNTER — Ambulatory Visit: Payer: Medicaid Other | Admitting: Neurology

## 2019-07-05 ENCOUNTER — Encounter: Payer: Self-pay | Admitting: Neurology

## 2019-07-05 ENCOUNTER — Ambulatory Visit: Payer: Medicaid Other | Admitting: Neurology

## 2019-07-05 ENCOUNTER — Other Ambulatory Visit: Payer: Self-pay

## 2019-07-05 VITALS — BP 128/78 | HR 80 | Temp 97.1°F | Ht 64.0 in | Wt 197.0 lb

## 2019-07-05 DIAGNOSIS — G44209 Tension-type headache, unspecified, not intractable: Secondary | ICD-10-CM | POA: Diagnosis not present

## 2019-07-05 DIAGNOSIS — R519 Headache, unspecified: Secondary | ICD-10-CM | POA: Diagnosis not present

## 2019-07-05 MED ORDER — CYCLOBENZAPRINE HCL 10 MG PO TABS: 10.0000 mg | ORAL_TABLET | Freq: Every evening | ORAL | 3 refills | Status: DC | PRN

## 2019-07-05 NOTE — Patient Instructions (Addendum)
Your neurological exam is normal.  Your brain MRI was also Reported as normal.  As discussed, I would like to proceed with a sleep study as we discussed last visit.  We can schedule your sleep study on your way out, your insurance has approved your study.  If you have obstructive sleep apnea it can contribute to recurrent headaches and treating sleep apnea may improve your headaches.  Please also schedule a follow-up appointment with your primary care physician, Dr. Darrelyn Hillock.  You are supposed to also have your vitamin D level rechecked with her. As discussed with you extensively again today, I will not be able to take you out of work or justify any type of disability, your exam is good again today, also your brain scan was benign. Your headaches are probably more in keeping with tension type headaches.  As discussed, I will try you on low-dose Flexeril which is a muscle relaxer.  Please be mindful that it can be sedating, meaning that it can make you sleepy, please start only at night for now, as needed.

## 2019-07-05 NOTE — Progress Notes (Signed)
Subjective:    Patient ID: Joanna Ashley is a 33 y.o. female.  HPI     Interim history:   Joanna Ashley is a 33 year old right-handed woman with an underlying medical history of anxiety obesity, who presents for follow-up consultation of her recurrent headaches.  The patient is accompanied by an interpreter today.  She was not able to keep her appointment on 05/24/2019. I first met her on 02/08/2019 at the request of the emergency room, where she presented in mid June with a history of recurrent headaches for the past few months.  Her prior history revealed that she had prior headaches but denied a history of migraines.  She did have some morning headaches.  She was advised to proceed with a brain MRI and a sleep study.  Her neurological exam was normal.  She was advised to use over-the-counter Advil or Tylenol for now.  I also check blood work including ESR, TSH, rheumatoid factor, ANA, vitamin D, A1c, RPR.  Blood work was unremarkable with the exception of low vitamin D.  She was advised to follow-up with her primary care physician and start an over-the-counter vitamin D supplement in the interim.   She did not return phone calls from the sleep lab to schedule her sleep study.  She had a brain MRI with and without contrast on 04/06/2019 and I reviewed the results: IMPRESSION:    Unremarkable MRI brain (with and without). No acute findings.  We attempted to call her for the results.  Today, 07/05/2019: She reports still having recurrent headaches, nearly daily at times, feels like a pressure-like sensation globally, sometimes it feels like a squeezing sensation around the crown of her head.  She has some associated nausea and takes as needed nausea medication, no vomiting, no visual aura, no significant throbbing headache.  Overall she also reports that she feels improved compared to 6 or 7 months ago.  She has seen Dr. Darrelyn Hillock recently and was supposed to have repeat vit. D with PCP, but has not  had it yet.  She denies any new symptoms.  She is willing to schedule her sleep study on her way out as she had not scheduled it yet.  She reports that she has not been able to work.  She is requesting paperwork to be filled out or sent in for her work.  I explained to her that based on my evaluation, she does not qualify for disability, she can certainly discuss with her primary care physician, but work-up and examination from my end of things does not justify staying out of work.  I explained this last time to her and repeatedly again today as she feels that I should fill out some paperwork for her to stay out of work.  I requested for the interpreter to explain my findings to her repeatedly.   The patient's allergies, current medications, family history, past medical history, past social history, past surgical history and problem list were reviewed and updated as appropriate.   Previously:  02/08/2019: 33 year old female with an underlying medical history of anxiety and mild obesity, who reports recurrent headaches since mid April.  She presented to the emergency room on 12/28/2018 with recurrent headaches.  She has a history of headaches for the past few years.  She was recently diagnosed with COVID-19 in May 2020, by nasopharyngeal swab.  She had additional blood work for cryptococcal antigen and HIV testing, both negative, CMP and CBC were unremarkable as well.  She was negative for  a point-of-care urine pregnancy test and prior to that she had a negative strep A test in May.  She declined a lumbar puncture in the emergency room.  She had a head CT recently on 12/01/2018 without contrast and I reviewed the results: IMPRESSION: Negative non contrasted CT appearance of the brain.  She was treated symptomatically in the emergency room with Tylenol, Compazine and Benadryl. She had a head CT without contrast with indication of headache, on 12/31/2015 and I reviewed the results: IMPRESSION: Normal exam.     She reports no history of prior headaches, no history of migraines, no family history of migraines.  She has felt hot like a fever but on the outside she does not actually have a fever.  She reports no snoring and sleeps fairly well but has woken up with a headache.  She has occasional nocturia.  She denies any recent stress.  She has taken Advil which increases her sweating.  She does not have a primary care physician and has not had a recent checkup.  She is a non-smoker, does not drink alcohol, drinks caffeine in the form of coffee, 1 cup/day and tea, 1 cup/day on average.  She hydrates well, averages about 3 bottles of water per day and some juice.  She goes to bed between 9 and 11, rise time around 7.  She reports that the headache is non-throbbing, it feels like a pressure in the back of her head and squeezing sensation in the front of her head, bilateral, no associated nausea, no vomiting, no photo phobia, no sonophobia.  She has had no neurological accompaniment, in particular, no associated one-sided weakness, numbness, tingling, droopy face or slurring of speech.  She lives with her 39 year old son.  Her husband lives in [place]. Her Epworth sleepiness score is 12 out of 24, fatigue severity score is 63 out of 63.She has not had a recent eye examination.  She does not have prescription eyeglasses.  She denies any visual symptoms, in particular, no blurry vision or double vision or loss of vision.  Her Past Medical History Is Significant For: Past Medical History:  Diagnosis Date  . Anxiety     Her Past Surgical History Is Significant For: No past surgical history on file.  Her Family History Is Significant For: No family history on file.  Her Social History Is Significant For: Social History   Socioeconomic History  . Marital status: Single    Spouse name: Not on file  . Number of children: Not on file  . Years of education: Not on file  . Highest education level: Not on file   Occupational History  . Not on file  Tobacco Use  . Smoking status: Never Smoker  . Smokeless tobacco: Never Used  Substance and Sexual Activity  . Alcohol use: No  . Drug use: No  . Sexual activity: Not Currently  Other Topics Concern  . Not on file  Social History Narrative   ** Merged History Encounter **       Social Determinants of Health   Financial Resource Strain:   . Difficulty of Paying Living Expenses: Not on file  Food Insecurity:   . Worried About Charity fundraiser in the Last Year: Not on file  . Ran Out of Food in the Last Year: Not on file  Transportation Needs:   . Lack of Transportation (Medical): Not on file  . Lack of Transportation (Non-Medical): Not on file  Physical Activity:   .  Days of Exercise per Week: Not on file  . Minutes of Exercise per Session: Not on file  Stress:   . Feeling of Stress : Not on file  Social Connections:   . Frequency of Communication with Friends and Family: Not on file  . Frequency of Social Gatherings with Friends and Family: Not on file  . Attends Religious Services: Not on file  . Active Member of Clubs or Organizations: Not on file  . Attends Archivist Meetings: Not on file  . Marital Status: Not on file    Her Allergies Are:  Allergies  Allergen Reactions  . Other     Pt reports that she is allergic to shots given to her in the hospital but cannot name any medications.  :   Her Current Medications Are:  No outpatient encounter medications on file as of 07/05/2019.   No facility-administered encounter medications on file as of 07/05/2019.  :  Review of Systems:  Out of a complete 14 point review of systems, all are reviewed and negative with the exception of these symptoms as listed below:  Review of Systems  Neurological:       Pt presents today with interpretor. She states that she still has daily headaches that are intermittent throughout the day. She states that she never scheduled the  sleep study due to not truly knowing what it was for and the purpose. I advised and educated on the purpose of the study. Informed that I would reach out to sleep lab and see what she was approved for in hopes of getting her possibly scheduled before leaving today.     Objective:  Neurological Exam  Physical Exam Physical Examination:   Vitals:   07/05/19 1137  BP: 128/78  Pulse: 80  Temp: (!) 97.1 F (36.2 C)    General Examination: The patient is a very pleasant 33 y.o. female in no acute distress. She appears well-developed and well-nourished and well groomed.   HEENT: Normocephalic, atraumatic, pupils are equal, round and reactive to light and accommodation. No photophobia. Extraocular tracking is good without limitation to gaze excursion or nystagmus noted. Normal smooth pursuit is noted. Hearing is grossly intact. Face is symmetric with normal facial animation and normal facial sensation. Speech is clear with no dysarthria noted. There is no hypophonia. There is no lip, neck/head, jaw or voice tremor. Neck is supple with full range of passive and active motion. There are no carotid bruits on auscultation. Oropharynx exam reveals: mild to moderate mouth dryness, good dental hygiene and mild airway crowding, due to Small airway entry, tonsils of 1+ bilaterally.  Tongue protrudes centrally in palate elevates symmetrically.    Chest: Clear to auscultation without wheezing, rhonchi or crackles noted.  Heart: S1+S2+0, regular and normal without murmurs, rubs or gallops noted.   Abdomen: Soft, non-tender and non-distended with normal bowel sounds appreciated on auscultation.  Extremities: There is no pitting edema in the distal lower extremities bilaterally.   Skin: Warm and dry without trophic changes noted.  Musculoskeletal: exam reveals no obvious joint deformities, tenderness or joint swelling or erythema.   Neurologically:  Mental status: The patient is awake, alert and  oriented in all 4 spheres. Her immediate and remote memory, attention, language skills and fund of knowledge are appropriate. There is no evidence of aphasia, agnosia, apraxia or anomia. Speech is clear with normal prosody and enunciation. Thought process is linear. Mood is normal and affect is normal.  Cranial nerves II -  XII are as described above under HEENT exam. Motor exam: Normal bulk, strength and tone is noted. There is no drift, tremor or rebound. Romberg is negative. Reflexes are 2+ throughout. Babinski: Toes are flexor bilaterally. Fine motor skills and coordination: intact with normal finger taps, normal hand movements, normal rapid alternating patting, normal foot taps and normal foot agility.  Cerebellar testing: No dysmetria or intention tremor on finger to nose testing. Heel to shin is unremarkable bilaterally. There is no truncal or gait ataxia.  Sensory exam: intact to light touch in the upper and lower extremities.  Gait, station and balance: She stands easily. No veering to one side is noted. No leaning to one side is noted. Posture is age-appropriate and stance is narrow based. Gait shows normal stride length and normal pace. No problems turning are noted. Tandem walk is unremarkable.     Assessment and Plan:   In summary, Joanna Ashley is a very pleasant 33 year old female with an underlying medical history of anxiety and mild obesity, who presents for For follow-up consultation of her recurrent headaches.  She has undergone a brain MRI in the interim which was benign.  She has not yet scheduled her sleep study as we previously discussed during our first appointment.  She is willing to schedule her sleep study on her way out today.  She has a nonmigrainous headache, likely a tension type headache.  I suggested a cautious trial of Flexeril low dose, advised her of the potential sedation from the medication and suggested that she try it at night as needed only. Her neurological exam  continues to be benign and nonfocal, MRI results were also discussed today.  She had some blood work through our office and had low vitamin D, she had been advised to start vitamin D.  She indicates no knowledge of having a primary care physician.  She saw Darrelyn Hillock in September and was supposed to have a follow-up check on her vitamin D level.  She is encouraged to make a follow-up appointment with her primary care physician and get her vitamin D level also rechecked at the time.  We will call her with her sleep study results, I plan to see her back routinely in 3 to 4 months, sooner if needed.  She is encouraged to stay well-hydrated.  She is advised that from my end of things I do not see any neurological justification for her to stay out of work.  I explained this several times to her today. She is advised that sleep apnea can be a Contributor to her recurrent headaches.  She does report occasionally waking up with a headache but sometimes her headaches are more in the afternoons.  I answered all her questions today and the patient was in agreement with the above plan.

## 2019-08-09 ENCOUNTER — Other Ambulatory Visit: Payer: Self-pay | Admitting: Family Medicine

## 2019-08-22 ENCOUNTER — Ambulatory Visit (INDEPENDENT_AMBULATORY_CARE_PROVIDER_SITE_OTHER): Payer: Medicaid Other | Admitting: Family Medicine

## 2019-08-22 ENCOUNTER — Encounter: Payer: Self-pay | Admitting: Family Medicine

## 2019-08-22 ENCOUNTER — Other Ambulatory Visit: Payer: Self-pay

## 2019-08-22 VITALS — BP 106/58 | HR 79 | Wt 193.2 lb

## 2019-08-22 DIAGNOSIS — G44229 Chronic tension-type headache, not intractable: Secondary | ICD-10-CM | POA: Diagnosis not present

## 2019-08-22 DIAGNOSIS — R519 Headache, unspecified: Secondary | ICD-10-CM | POA: Diagnosis not present

## 2019-08-22 DIAGNOSIS — E559 Vitamin D deficiency, unspecified: Secondary | ICD-10-CM | POA: Diagnosis not present

## 2019-08-22 HISTORY — DX: Chronic tension-type headache, not intractable: G44.229

## 2019-08-22 MED ORDER — AMITRIPTYLINE HCL 25 MG PO TABS: 25.0000 mg | ORAL_TABLET | Freq: Every day | ORAL | 0 refills | Status: DC

## 2019-08-22 NOTE — Progress Notes (Signed)
   CHIEF COMPLAINT / HPI:  [employer] interpreter via iPad used for the duration of the visit.  History taking challenged by language barrier.  Headaches: Felt to be chronic tension in nature, however also considering possible sleep apnea contribution.  Has not been able to get sleep study yet, she needs someone to watch her child when she gets this completed.  Follows with neurology (last seen in 06/2019), unremarkable neurological exam, comprehensive labs, and MRI brain.  Started on a low-dose of Flexeril per her neurologist in 06/2019.  Describes as pressure in occipital area that will reach to her bilateral frontal area with pressure.  Will last several hours to days at a time.  Denies any weakness, numbness/tingling, photophobia, phonophobia, nausea/vomiting, visual changes, rhinorrhea.  She works for [employer], however she has not been able to work in quite some time due to these debilitating headaches.  She requests a letter stating she has been seeking medical care for headaches since April.  She is confused as to what a sleep study would test for.  Extensively discussed what a sleep study and sleep apnea are, discuss treatment options if she were to end up having sleep apnea.  Vitamin D deficiency: 12.5 in 01/2019.  Has been on vitamin D supplementation.  Will recheck today.  PERTINENT  PMH / PSH: Chronic headaches, vitamin D deficiency, mild COVID-19 virus in 11/2018   OBJECTIVE: BP (!) 106/58   Pulse 79   Wt 193 lb 3.2 oz (87.6 kg)   LMP 08/13/2019 (Approximate)   SpO2 98%   BMI 33.16 kg/m   General: Alert, NAD HEENT: NCAT, MMM, oropharynx nonerythematous  Lungs: Unlabored breathing Msk: Moves all extremities spontaneously  Ext: Warm, dry Neuro: Alert and oriented.  Smile symmetrical.  Follows commands and holds conversation appropriately.  EOMI, PERRLA.  CN II-XII intact.  5/5 upper and lower extremity strength bilaterally.  Sensation to light touch intact.  Rapid hand movement intact.   Normal gait.  ASSESSMENT / PLAN:  Chronic tension headaches Debilitating and reports unable to work, but reassuringly neurologically intact. Following with neurology, already undergone extensive work-up with normal brain MRI and comprehensive labs.  She has tried Flexeril without success, will trial low-dose amitriptyline nightly.  Prescribed 25 mg, to start with 12.5 for the first 1-2 weeks to mitigate side effects.  Will also refer her to the headache wellness center for further modality options in the goal to have her return to work with reduced headache burden.  Additionally encouraged her to schedule her sleep study in the near future to rule out contributing sleep apnea.  Follow-up in 1 month with myself, scheduled for 3/3 during her appointment.  Vitamin D deficiency On supplementation since 01/2019.  Will recheck level today.    Provided with a letter that she is receiving care through specialist for her headaches since 10/2018. Follow-up scheduled on 3/3 or sooner if needed.  Joanna Stack, DO [facility]

## 2019-08-22 NOTE — Patient Instructions (Signed)
It was wonderful seeing you today.  We are going to try a medication called amitriptyline that you will take at bedtime to help with your headaches.  Stop taking the Flexeril.  I am also going to refer you to the headache wellness center in [place] to discuss any further treatment options.  I would like you to follow-up in 1 month or sooner if needed.

## 2019-08-22 NOTE — Assessment & Plan Note (Signed)
On supplementation since 01/2019.  Will recheck level today.

## 2019-08-22 NOTE — Assessment & Plan Note (Addendum)
Debilitating and reports unable to work, but reassuringly neurologically intact. Following with neurology, already undergone extensive work-up with normal brain MRI and comprehensive labs.  She has tried Flexeril without success, will trial low-dose amitriptyline nightly.  Prescribed 25 mg, to start with 12.5 for the first 1-2 weeks to mitigate side effects.  Will also refer her to the headache wellness center for further modality options in the goal to have her return to work with reduced headache burden.  Additionally encouraged her to schedule her sleep study in the near future to rule out contributing sleep apnea.  Follow-up in 1 month with myself, scheduled for 3/3 during her appointment.

## 2019-08-23 LAB — VITAMIN D 25 HYDROXY (VIT D DEFICIENCY, FRACTURES): Vit D, 25-Hydroxy: 21.5 ng/mL — ABNORMAL LOW (ref 30.0–100.0)

## 2019-08-29 ENCOUNTER — Telehealth: Payer: Self-pay

## 2019-08-29 ENCOUNTER — Other Ambulatory Visit: Payer: Self-pay | Admitting: Family Medicine

## 2019-08-29 DIAGNOSIS — E559 Vitamin D deficiency, unspecified: Secondary | ICD-10-CM

## 2019-08-29 MED ORDER — VITAMIN D3 10 MCG (400 UNIT) PO CAPS: 2.0000 | ORAL_CAPSULE | Freq: Every day | ORAL | 1 refills | Status: DC

## 2019-08-29 NOTE — Telephone Encounter (Signed)
Called patient using [employer] [place] ID # [id number].  There was no answer.  A messgage was left with results and informing patient of instructions per Dr. Annia Friendly.  Glennie Hawk, CMA

## 2019-09-14 ENCOUNTER — Ambulatory Visit (INDEPENDENT_AMBULATORY_CARE_PROVIDER_SITE_OTHER): Payer: Medicaid Other | Admitting: Family Medicine

## 2019-09-14 ENCOUNTER — Other Ambulatory Visit: Payer: Self-pay

## 2019-09-14 ENCOUNTER — Encounter: Payer: Self-pay | Admitting: Family Medicine

## 2019-09-14 VITALS — BP 112/62 | HR 71 | Wt 197.2 lb

## 2019-09-14 DIAGNOSIS — R519 Headache, unspecified: Secondary | ICD-10-CM

## 2019-09-14 DIAGNOSIS — G44229 Chronic tension-type headache, not intractable: Secondary | ICD-10-CM

## 2019-09-14 DIAGNOSIS — E559 Vitamin D deficiency, unspecified: Secondary | ICD-10-CM | POA: Diagnosis not present

## 2019-09-14 MED ORDER — AMITRIPTYLINE HCL 25 MG PO TABS: 25.0000 mg | ORAL_TABLET | Freq: Every day | ORAL | 2 refills | Status: DC

## 2019-09-14 NOTE — Assessment & Plan Note (Signed)
Significant improvement in HA and sleep with addition of low-dose amitriptyline nightly.  Fortunately, this has dramatically decreased her headache burden and feels stable to return to work, provided work note.  While her headaches have improved with amitriptyline, do believe it would still be of benefit to obtain a sleep study, will touch base with her neurology office to see if they have access to home sleep studies.  Encouraged her to stay hydrated and continue massage therapy as needed for tense musculature.  Will have her follow-up in approximately 2 months or sooner if needed, refilled amitriptyline.

## 2019-09-14 NOTE — Assessment & Plan Note (Signed)
Recheck level on follow-up.

## 2019-09-14 NOTE — Patient Instructions (Signed)
Wonderful to see you! I have sent refills of your medicine to the pharmacy. I will call the neurologist to see if we can get a home sleep study.   I will see you back in 2 months or sooner if needed. We'll get you scheduled in April when that can be done.

## 2019-09-14 NOTE — Progress Notes (Signed)
    SUBJECTIVE:   CHIEF COMPLAINT / HPI:  Follow up HA   Headaches: Recently seen by myself on 2/8 for chronic tension headaches, already undergone full neurology work-up with the exception of sleep study.  Started on low-dose amitriptyline at bedtime to help with pain and sleep, she is following up today on this new medication.  States amitriptyline has done wonders, she is now having a full night sleep with decreased headache burden.  She is very grateful and excited to get back to work.  Still having headaches, but able to manage them more effectively now, less severe.  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  For future visits she requests [employer] [id number] if possible, interpreter states if we call the day before we may be able to have her schedule specifically for our office visit.  PERTINENT  PMH / PSH: Vitamin D deficiency, chronic tension headaches, COVID-19 in 11/2018  OBJECTIVE:   BP 112/62   Pulse 71   Wt 197 lb 3.2 oz (89.4 kg)   SpO2 97%   BMI 33.85 kg/m   General: Alert, NAD HEENT: NCAT, MMM, EOMI  Cardiac: RRR no m/g/r Lungs: Clear bilaterally, no increased WOB  Msk/neuro: Alert and oriented.  Able to follow commands, follows normal conversation.  Smile symmetrical.  Moves all extremities spontaneously, normal gait. Ext: Warm, dry  ASSESSMENT/PLAN:   Chronic tension headaches Significant improvement in HA and sleep with addition of low-dose amitriptyline nightly.  Fortunately, this has dramatically decreased her headache burden and feels stable to return to work, provided work note.  While her headaches have improved with amitriptyline, do believe it would still be of benefit to obtain a sleep study, will touch base with her neurology office to see if they have access to home sleep studies.  Encouraged her to stay hydrated  and continue massage therapy as needed for tense musculature.  Will have her follow-up in approximately 2 months or sooner if needed, refilled amitriptyline.  Vitamin D deficiency Recheck level on follow-up.    Follow-up in 2 months for above or sooner if needed.  Joanna Stack, DO [facility]

## 2019-09-22 ENCOUNTER — Telehealth: Payer: Self-pay | Admitting: Family Medicine

## 2019-09-22 ENCOUNTER — Other Ambulatory Visit: Payer: Self-pay | Admitting: Family Medicine

## 2019-09-22 DIAGNOSIS — G44229 Chronic tension-type headache, not intractable: Secondary | ICD-10-CM

## 2019-09-22 NOTE — Telephone Encounter (Signed)
Placed referral to [facility] sleep studies to hopefully get a home sleep study set up for her. Called patient with interpreter to let her know this referral was placed and that I scheduled her with myself for follow up on 11/07/2019 at 3:330pm.   [name], DO

## 2019-11-03 ENCOUNTER — Telehealth: Payer: Self-pay

## 2019-11-03 ENCOUNTER — Encounter: Payer: Self-pay | Admitting: Neurology

## 2019-11-03 ENCOUNTER — Ambulatory Visit: Payer: Medicaid Other | Admitting: Neurology

## 2019-11-03 NOTE — Telephone Encounter (Signed)
Patient no showed 11/03/2019 f/u with Dr. Frances Furbish. This is her second no show.

## 2019-11-07 ENCOUNTER — Ambulatory Visit: Payer: Medicaid Other | Admitting: Family Medicine

## 2019-11-23 ENCOUNTER — Other Ambulatory Visit: Payer: Self-pay

## 2019-11-23 ENCOUNTER — Ambulatory Visit (INDEPENDENT_AMBULATORY_CARE_PROVIDER_SITE_OTHER): Payer: Medicaid Other | Admitting: Family Medicine

## 2019-11-23 ENCOUNTER — Encounter: Payer: Self-pay | Admitting: Family Medicine

## 2019-11-23 VITALS — BP 110/62 | HR 90 | Ht 64.0 in | Wt 193.0 lb

## 2019-11-23 DIAGNOSIS — G44229 Chronic tension-type headache, not intractable: Secondary | ICD-10-CM

## 2019-11-23 DIAGNOSIS — R519 Headache, unspecified: Secondary | ICD-10-CM

## 2019-11-23 DIAGNOSIS — E559 Vitamin D deficiency, unspecified: Secondary | ICD-10-CM

## 2019-11-23 NOTE — Patient Instructions (Signed)
Phone number [phone] Fax [phone]

## 2019-11-23 NOTE — Progress Notes (Signed)
.     SUBJECTIVE:   CHIEF COMPLAINT / HPI: F/u headaches/vitamin D   T[place]rinian interpreter via iPad used for duration of visit.  Joanna Ashley is a 34 yo female presenting for follow up.  Ran out of her amitriptyline 3 weeks ago, persistent headaches and difficulty sleeping returned shortly after.  However, prior to this was doing wonderful in terms of decrease frequency of headaches and better sleep.  She did not realize they were refills on this medication.  Only got 3 hours of sleep last night due to headache, pain 6/10.  Bilateral frontal, similar to previous.  She has been taking vitamin D3 400 U daily.  Heard from the sleep center, however was not able to talk at that time and has lost their number.  She has not returned back to work yet because her job at [employer] will not place her in her previous position due to the time that she has been away.  Requesting a letter stating she has been in my care since 2020.  PERTINENT  PMH / PSH: Chronic tension headaches, vitamin D def   OBJECTIVE:   BP 110/62   Pulse 90   Ht 5\' 4"  (1.626 m)   Wt 193 lb (87.5 kg)   LMP 10/31/2019 (Exact Date)   SpO2 100%   BMI 33.13 kg/m   General: Alert, NAD HEENT: NCAT, MMM Cardiac: RRR no m/g/r Lungs: Clear bilaterally, no increased WOB  Msk: Moves all extremities spontaneously  Ext: Warm, dry Neuro: Alert and oriented X3.  Speech normal with symmetrical smile.  PERRLA, EOMI.  Follows commands without concern.  Follows logical conversation.  Gait normal.   ASSESSMENT/PLAN:   Chronic tension headaches Well-controlled with low-dose amitriptyline.  Encouraged continued hydration, massage therapy, and balanced diet.  Follow-up in 3-4 months, refilled amitriptyline 25 mg nightly.  Vitamin D deficiency Recheck level today.  Continue vitamin D supplementation and sun exposure when possible.    Follow-up in 3-4 months or sooner if needed.  Provided letter to her work, provided patient with our phone/fax number in  case her work would like to discuss further.  11/02/2019, DO [facility]

## 2019-11-24 ENCOUNTER — Encounter: Payer: Self-pay | Admitting: Family Medicine

## 2019-11-24 LAB — VITAMIN D 25 HYDROXY (VIT D DEFICIENCY, FRACTURES): Vit D, 25-Hydroxy: 20.6 ng/mL — ABNORMAL LOW (ref 30.0–100.0)

## 2019-11-24 MED ORDER — AMITRIPTYLINE HCL 25 MG PO TABS: 25.0000 mg | ORAL_TABLET | Freq: Every day | ORAL | 4 refills | Status: DC

## 2019-11-24 NOTE — Assessment & Plan Note (Signed)
Well-controlled with low-dose amitriptyline.  Encouraged continued hydration, massage therapy, and balanced diet.  Follow-up in 3-4 months, refilled amitriptyline 25 mg nightly.

## 2019-11-24 NOTE — Assessment & Plan Note (Signed)
Recheck level today.  Continue vitamin D supplementation and sun exposure when possible.

## 2019-12-02 IMAGING — CT CT HEAD WITHOUT CONTRAST
3 of 4 series · 16 of 47 positions shown, 19 images · non-contrast
Comparison: CT brain 12/31/2015

CLINICAL DATA: Vertigo

EXAM:
CT HEAD WITHOUT CONTRAST
TECHNIQUE: Contiguous axial images were obtained from the base of the skull
through the vertex without intravenous contrast.

[Series 5: head 2.0 h70h · axial · 0.41mm/px · z∈[-120,+26]mm · 10 of 83 slices shown, 13 images]
[im 5/83  brain]
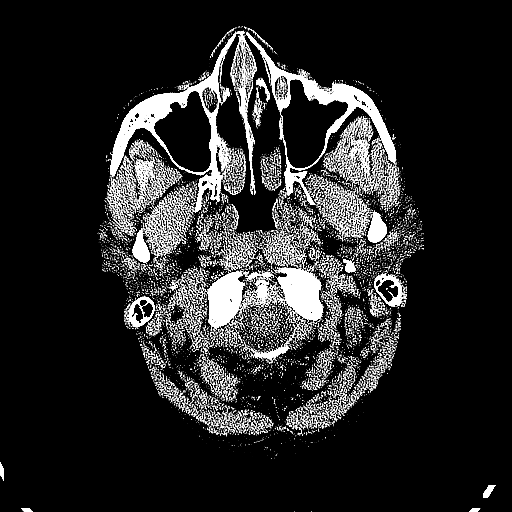
[im 5/83  bone]
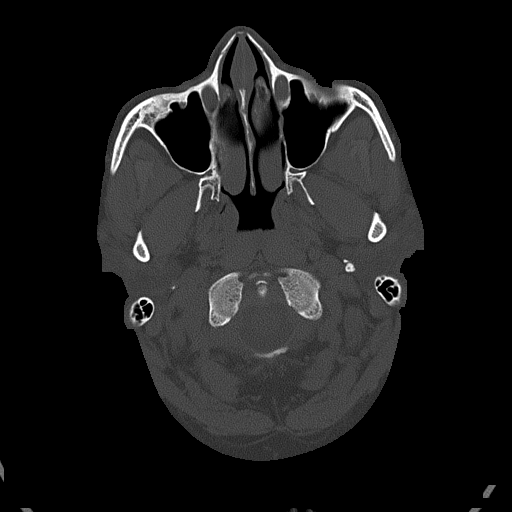
[im 13/83  brain]
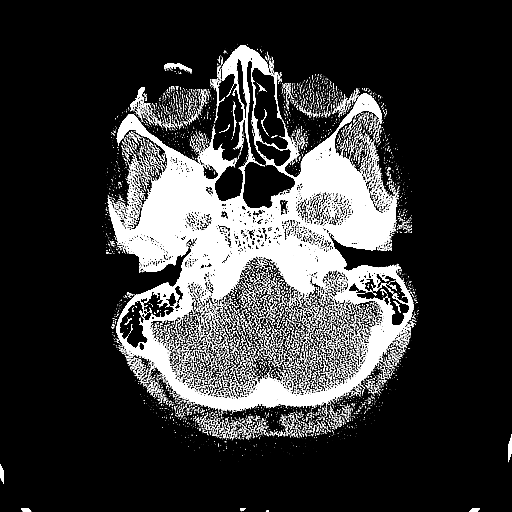
[im 21/83  brain]
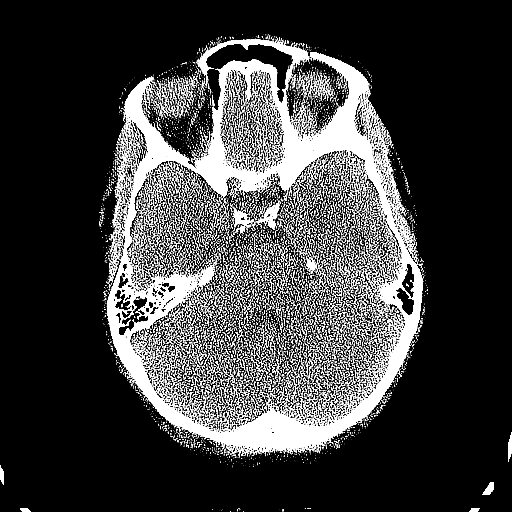
[im 29/83  brain]
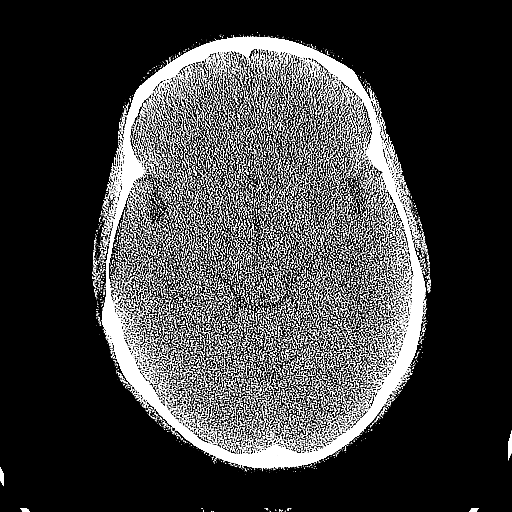
[im 37/83  brain]
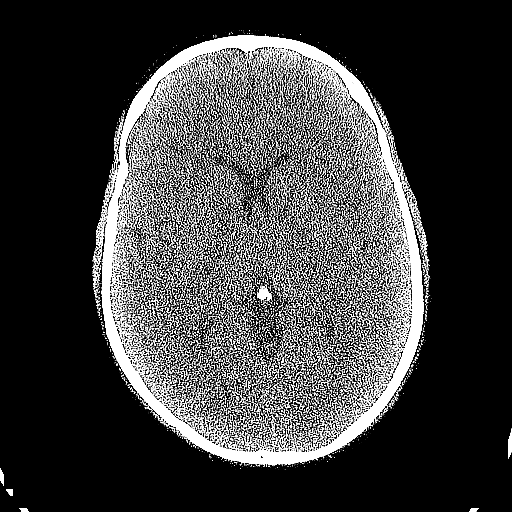
[im 37/83  bone]
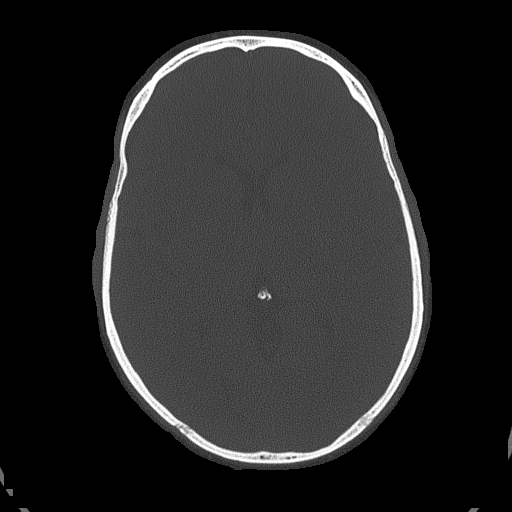
[im 46/83  brain]
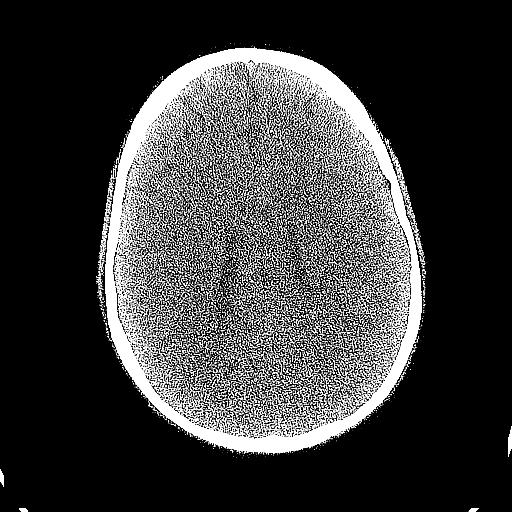
[im 54/83  brain]
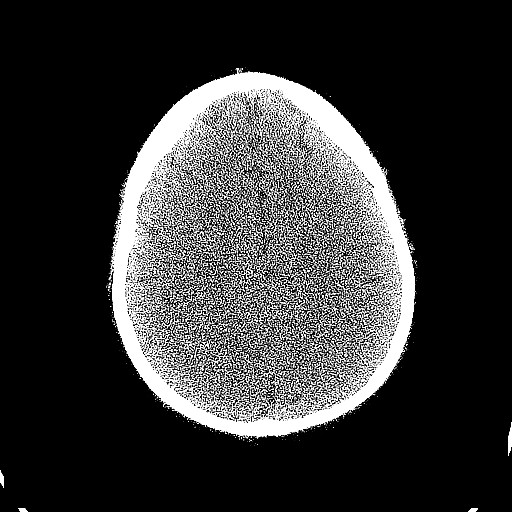
[im 62/83  brain]
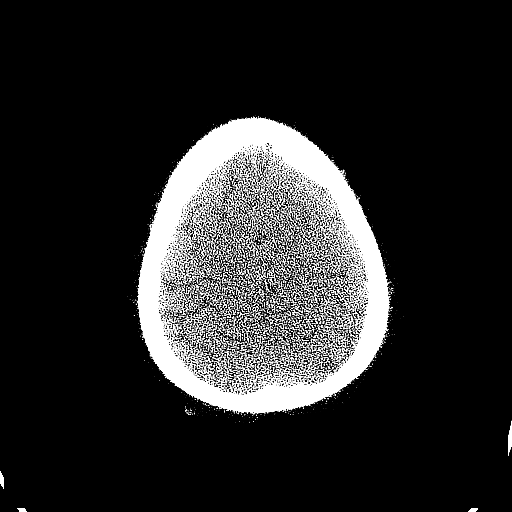
[im 70/83  brain]
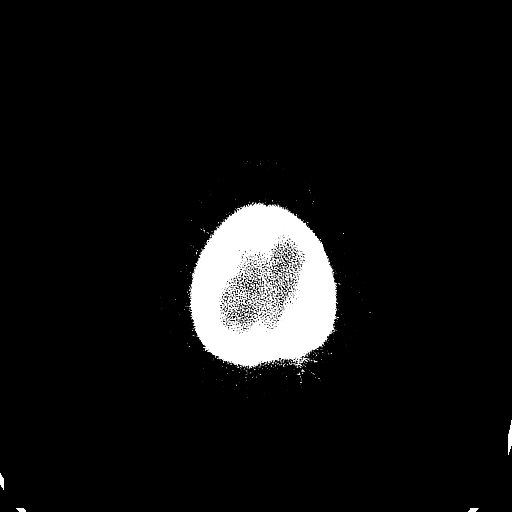
[im 70/83  bone]
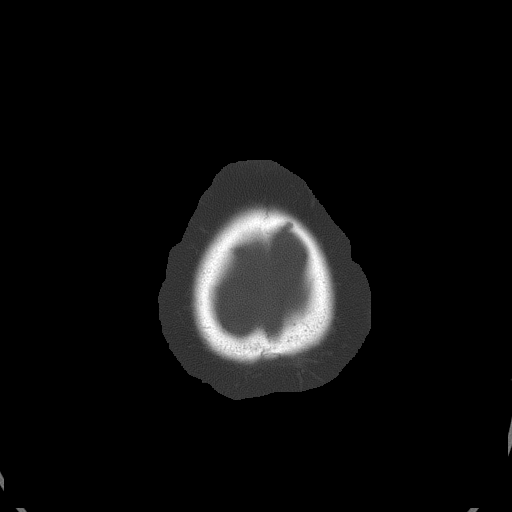
[im 78/83  brain]
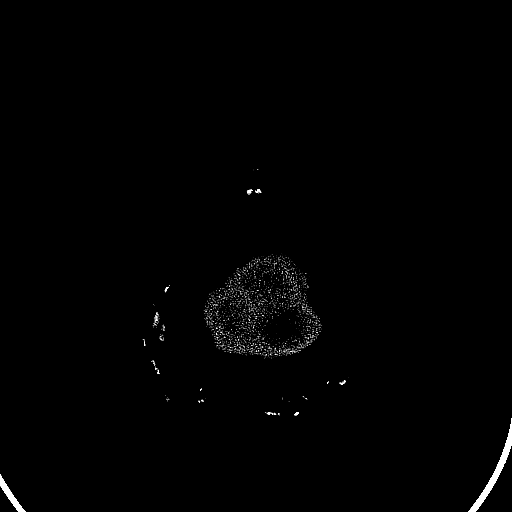

[Series 6: head 3.0 mpr cor · coronal · 0.30mm/px · 3 of 67 slices shown]
[im 23/67  brain]
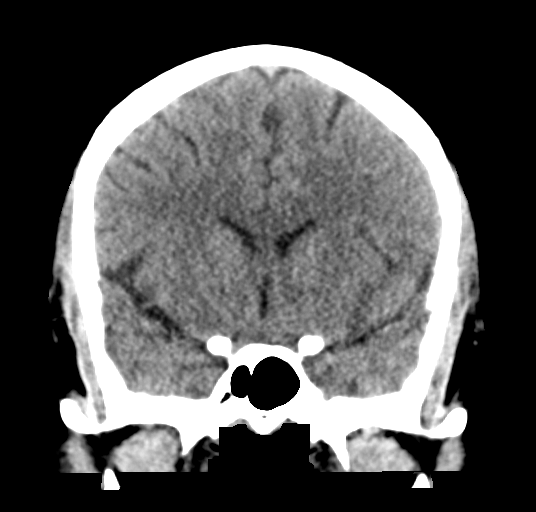
[im 30/67  brain]
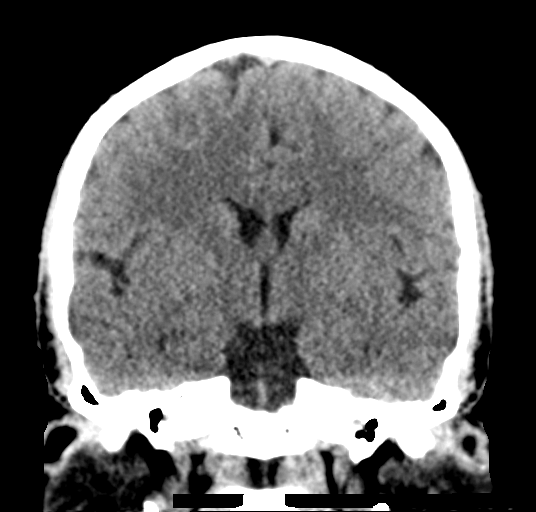
[im 37/67  brain]
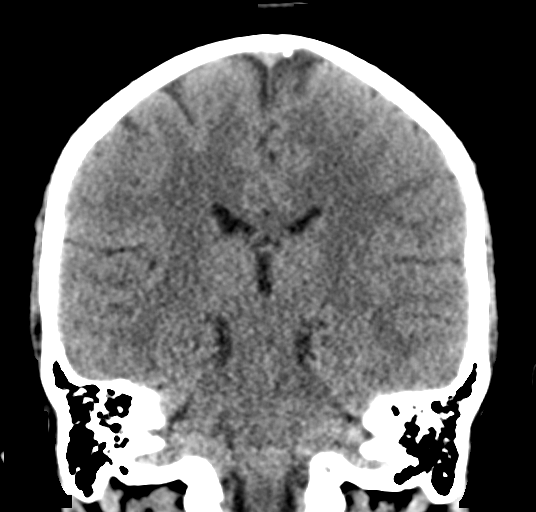

[Series 7: head 3.0 mpr sag · sagittal · 0.32mm/px · 3 of 56 slices shown]
[im 19/56  brain]
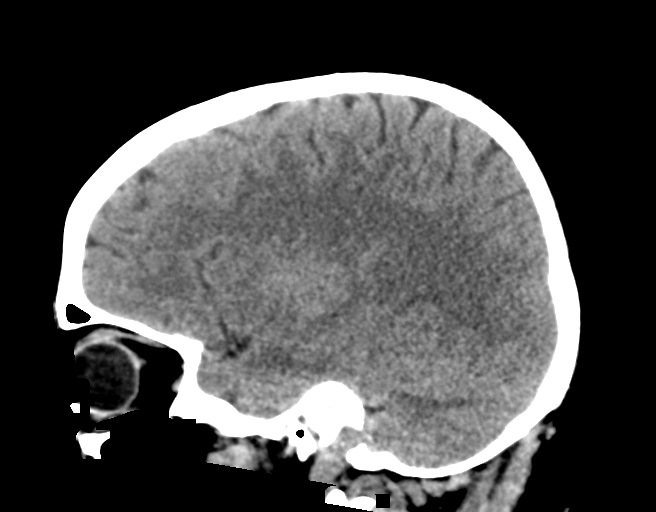
[im 28/56  brain]
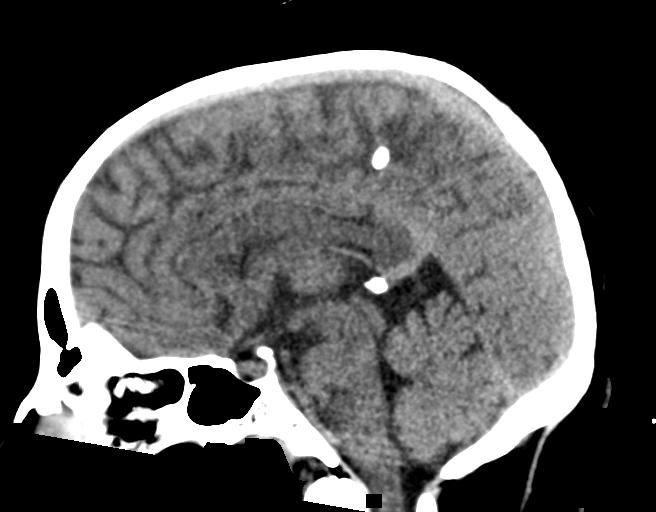
[im 37/56  brain]
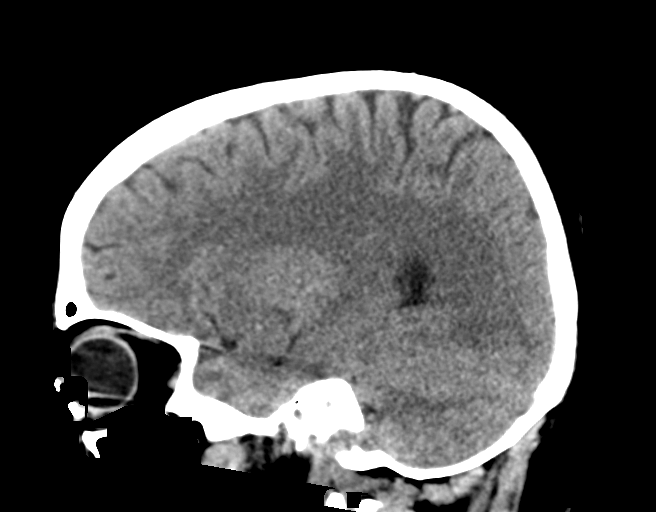

[16 of 47 positions shown; findings below may reference images not displayed]

FINDINGS: Brain: No evidence of acute infarction, hemorrhage, hydrocephalus,
extra-axial collection or mass lesion/mass effect.

Vascular: No hyperdense vessel or unexpected calcification.

Skull: Normal. Negative for fracture or focal lesion.

Sinuses/Orbits: No acute finding.

Other: None
IMPRESSION: Negative non contrasted CT appearance of the brain

## 2019-12-14 ENCOUNTER — Other Ambulatory Visit: Payer: Self-pay | Admitting: Family Medicine

## 2019-12-14 DIAGNOSIS — E559 Vitamin D deficiency, unspecified: Secondary | ICD-10-CM

## 2019-12-14 MED ORDER — VITAMIN D (ERGOCALCIFEROL) 1.25 MG (50000 UNIT) PO CAPS: 50000.0000 [IU] | ORAL_CAPSULE | ORAL | 0 refills | Status: DC

## 2019-12-30 ENCOUNTER — Other Ambulatory Visit: Payer: Self-pay | Admitting: Family Medicine

## 2019-12-30 ENCOUNTER — Telehealth: Payer: Self-pay | Admitting: Family Medicine

## 2019-12-30 NOTE — Telephone Encounter (Signed)
Late documentation. Attempted to reach patient to inform her of paperwork using interpreter around 1300. No answer and left voicemail. Will try again to hopefully reach her before end of today.   [name], DO

## 2019-12-30 NOTE — Progress Notes (Signed)
Please call patient and let her know that I have completed her FMLA paperwork with an additional letter attached.  It is placed at the front office.  Unfortunately, I understand that her paperwork is due today.  Please let her know if she is able to come up and sign the paperwork that we would be more than happy to fax it over, or she can take it into her work as well.  [name], DO

## 2020-01-04 ENCOUNTER — Encounter: Payer: Self-pay | Admitting: Family Medicine

## 2020-01-04 ENCOUNTER — Other Ambulatory Visit: Payer: Self-pay

## 2020-01-04 ENCOUNTER — Ambulatory Visit (INDEPENDENT_AMBULATORY_CARE_PROVIDER_SITE_OTHER): Payer: Medicaid Other | Admitting: Family Medicine

## 2020-01-04 VITALS — BP 98/60 | HR 67 | Wt 190.0 lb

## 2020-01-04 DIAGNOSIS — E559 Vitamin D deficiency, unspecified: Secondary | ICD-10-CM | POA: Diagnosis not present

## 2020-01-04 DIAGNOSIS — M25561 Pain in right knee: Secondary | ICD-10-CM

## 2020-01-04 DIAGNOSIS — M25562 Pain in left knee: Secondary | ICD-10-CM

## 2020-01-04 DIAGNOSIS — G8929 Other chronic pain: Secondary | ICD-10-CM | POA: Diagnosis not present

## 2020-01-04 MED ORDER — MELOXICAM 7.5 MG PO TABS: 7.5000 mg | ORAL_TABLET | Freq: Every day | ORAL | 0 refills | Status: DC

## 2020-01-04 MED ORDER — VITAMIN D (ERGOCALCIFEROL) 1.25 MG (50000 UNIT) PO CAPS: 50000.0000 [IU] | ORAL_CAPSULE | ORAL | 0 refills | Status: DC

## 2020-01-04 NOTE — Patient Instructions (Signed)
It was wonderful to see you today!  It is likely from your foot injuries several years ago that you have been walking differently and causing knee pains.   I will place a referral for physical therapy.  I also sent in a short course of anti-inflammatory medications.

## 2020-01-04 NOTE — Assessment & Plan Note (Signed)
Acute on chronic, following bilateral ankle injuries in 2016.  Knee exam is largely unremarkable with the exception of mild anterior tenderness and bilateral feet supination while walking.  Suspect longstanding change in gait mechanics has contributed to repetitive knee irritation.  Rx'd short course of meloxicam, ice/heat, and rest. Placed referral to PT per patient request for strength and gait improvement.

## 2020-01-04 NOTE — Progress Notes (Signed)
    SUBJECTIVE:   CHIEF COMPLAINT / HPI: vitamin D recheck and knee pain  Tigrinian interpreter via iPad used for duration of visit.  Vitamin D deficiency: 25 hydroxy level 20 in 11/2019.  She has consistently been taking OTC 2000 IU vitamin D, as previous supplements have made her feel nauseous.  Send in 50,000 U vitamin D several weeks ago, however did not realize that the vitamin D was ready at her pharmacy and has not taken it.  Bilateral knee pain: Chronic, reports present since approximately 2016/2017 after bilateral ankle injury at work.  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  She also had the same knee pains at that time.  Predominantly anterior, occasionally throbbing.  She had done physical therapy back then with some improvement, however the pains have come back.  Worse with walking long periods of time.  She has not tried anything to help, requesting physical therapy and x-rays today.  Denies any locking, popping, clicking, giving out sensation, weakness, or numbness/tingling.  PERTINENT  PMH / PSH: Chronic tension headaches, vitamin D deficiency, Covid in 11/2018  OBJECTIVE:   BP 98/60   Pulse 67   Wt 190 lb (86.2 kg)   LMP 12/31/2019   SpO2 98%   BMI 32.61 kg/m   General: Alert, NAD HEENT: NCAT, MMM  Cardiac: RRR no m/g/r Lungs: Clear bilaterally, no increased WOB  Msk:  Bilateral knees: - Inspection: no gross deformity. No swelling/effusion, erythema or bruising. Skin intact - Palpation: Tenderness to palpation around medial and lateral joint lines bilaterally. - ROM: full active ROM with flexion and extension in knee and hip - Strength: 5/5 strength - Neuro/vasc: NV intact - Special Tests: - LIGAMENTS: negative anterior and posterior drawer, no MCL or LCL laxity  -- MENISCUS: negative McMurray's -- PF JOINT: Negative patellar grind -Normal gait, supination of feet while  walking. Hips: normal ROM  Ext: Warm, dry, 2+ distal pulses, no edema   ASSESSMENT/PLAN:   Vitamin D deficiency 20.6 in 11/2019.  Due to miscommunications and language barrier, had not started increase her vitamin D dose.  Will resend in weekly 50,000 U and recheck in the next 4-5 weeks.  Bilateral knee pain Acute on chronic, following bilateral ankle injuries in 2016.  Knee exam is largely unremarkable with the exception of mild anterior tenderness and bilateral feet supination while walking.  Suspect longstanding change in gait mechanics has contributed to repetitive knee irritation.  Rx'd short course of meloxicam, ice/heat, and rest. Placed referral to PT per patient request for strength and gait improvement.    Follow-up in approximately 1-2 months for above or sooner if needed.   [name], DO [facility]

## 2020-01-04 NOTE — Assessment & Plan Note (Signed)
20.6 in 11/2019.  Due to miscommunications and language barrier, had not started increase her vitamin D dose.  Will resend in weekly 50,000 U and recheck in the next 4-5 weeks.

## 2020-01-06 NOTE — Progress Notes (Signed)
Was not sure that patient had come in because book upfront was not signed.  Spoke to [facility] and she confimed that patient came in , signed paperwork and [facility] faxed it to the correct destination.    Glennie Hawk, CMA

## 2020-01-13 ENCOUNTER — Telehealth: Payer: Self-pay | Admitting: Family Medicine

## 2020-01-13 NOTE — Telephone Encounter (Signed)
Called patient to discuss FMLA via Tigrinian interpreter.  Patient has been requesting FMLA paperwork for continuous leave since April 2020 due to chronic tension headaches.  She had an extensive evaluation through neurology starting in April until December 2020, I subsequently took over care in 08/2019.  She requested FMLA from her neurologist in 2020, declined at that time.  Since February, she has had a substantial improvement in her headaches with nightly amitriptyline.   However, she has not returned to work at [employer] yet as she reports they are offering her a lowered position compared to her previous and requiring FMLA for the duration of her leave to consider returning her to her previous place.  While it is certain that her language and social barriers have made it more challenging for her continuity of care and her reported symptoms have been debilitating, continuous medical leave was not mandated during this time.  Discussed with patient that I cannot rightfully fill out her FMLA stating that I mandated continuous leave from April 2020 until now (or even February from now).  Let her know I would be more than happy to send a letter to her work again stating she is under my care for her headaches.  I also would be more than happy to provide intermittent FMLA in the future when she does return to work allowing a few times a month of leave due to her headaches.  She reports understanding and will be calling her employer to see if a letter would be helpful.  [name], DO

## 2020-01-25 ENCOUNTER — Ambulatory Visit: Payer: Medicaid Other | Attending: Family Medicine | Admitting: Physical Therapy

## 2020-01-25 ENCOUNTER — Encounter: Payer: Self-pay | Admitting: Physical Therapy

## 2020-01-25 ENCOUNTER — Other Ambulatory Visit: Payer: Self-pay

## 2020-01-25 DIAGNOSIS — G8929 Other chronic pain: Secondary | ICD-10-CM

## 2020-01-25 DIAGNOSIS — M25561 Pain in right knee: Secondary | ICD-10-CM | POA: Diagnosis not present

## 2020-01-25 DIAGNOSIS — M25562 Pain in left knee: Secondary | ICD-10-CM | POA: Diagnosis not present

## 2020-01-25 DIAGNOSIS — M6281 Muscle weakness (generalized): Secondary | ICD-10-CM | POA: Diagnosis not present

## 2020-01-25 DIAGNOSIS — R600 Localized edema: Secondary | ICD-10-CM | POA: Diagnosis not present

## 2020-01-25 NOTE — Therapy (Signed)
[facility] [address] [place], [place], [address] Phone: [phone]   Fax:  [phone]  Physical Therapy Evaluation  Patient Details  Name: Joanna Ashley MRN: [id number] Date of Birth: [birth year] Referring Provider (PT): Carney Living, MD   Encounter Date: 01/25/2020   PT End of Session - 01/25/20 1419    Visit Number 1    Number of Visits 4    Authorization Type [employer]- auth submitted 7/14    PT Start Time 1415    PT Stop Time 1500    PT Time Calculation (min) 45 min    Activity Tolerance Patient tolerated treatment well    Behavior During Therapy [facility] for tasks assessed/performed           Past Medical History:  Diagnosis Date  . Anxiety     History reviewed. No pertinent surgical history.  There were no vitals filed for this visit.    Subjective Assessment - 01/25/20 1419    Subjective It happened bc I fell at work and I hurt my knees, they were swollen- around 2016 or 17. Healthcare at work did heat and massage. My knees still swell. I stopped working at the [employer] about a year and a half ago. When I first fell, they were trapped bw a metal structure which hurt my ankles. They were so swollen I could not work by Monday. When I fell at work, I hurt my ankles and then my knees began hurting. Recently all of the veins are starting to pop out and hurt. Reports doing some exercise occasionally- lay on my back and raise my legs up, walk outside.    How long can you stand comfortably? 5-10 min and then need rest    Patient Stated Goals housechores, cooking, stand for a long time    Currently in Pain? Yes    Pain Score 6     Pain Location Knee    Pain Orientation Right;Left    Pain Descriptors / Indicators Burning    Pain Radiating Towards ankles    Pain Onset More than a month ago    Pain Frequency Intermittent    Aggravating Factors  standing    Pain Relieving Factors rest, not wear  shoes, ice water on feet              [facility] PT Assessment - 01/25/20 0001      Assessment   Medical Diagnosis bil knee pain    Referring Provider (PT) Carney Living, MD    Onset Date/Surgical Date --   2016   Hand Dominance Right    Prior Therapy at healthcare at work      Precautions   Precautions None      Restrictions   Weight Bearing Restrictions No      Balance Screen   Has the patient fallen in the past 6 months No      Home Environment   Living Environment Private residence    Living Arrangements Alone;Spouse/significant other;Children    Additional Comments 8 or 9 stairs to bedroom      Prior Function   Level of Independence Independent    Vocation Unemployed      Cognition   Overall Cognitive Status Within Functional Limits for tasks assessed      Observation/Other Assessments   Focus on Therapeutic Outcomes (FOTO)  n/a MCD      Observation/Other Assessments-Edema    Edema Circumferential  Circumferential Edema   Circumferential - Right 25 ankle, 41.5 knee cm    Circumferential - Left  25.5 ankle, 41.5 knee      Sensation   Additional Comments WFL      ROM / Strength   AROM / PROM / Strength PROM;Strength      PROM   PROM Assessment Site Ankle    Right/Left Ankle Right;Left    Right Ankle Dorsiflexion 4    Left Ankle Dorsiflexion 12      Strength   Overall Strength Comments bil knee ext 4/5, flx 5/5    Strength Assessment Site Hip    Right/Left Hip Right;Left    Right Hip Flexion 4/5    Right Hip ABduction 4/5    Left Hip Flexion 4/5    Left Hip ABduction 4-/5      Flexibility   Soft Tissue Assessment /Muscle Length yes    Hamstrings bil to 50 deg                      Objective measurements completed on examination: See above findings.               PT Education - 01/25/20 1439    Education Details compression socks, shoe wear, anatomy of condition, POC, HEP, exercise form/rationale    Person(s)  Educated Patient    Methods Explanation;Demonstration;Tactile cues;Verbal cues;Handout    Comprehension Verbalized understanding;Returned demonstration;Verbal cues required;Tactile cues required;Need further instruction            PT Short Term Goals - 01/25/20 1505      PT SHORT TERM GOAL #1   Title Pt will demo gross strength 4+/5    Baseline see flowsheet    Time 3    Period Weeks    Status New    Target Date 02/17/20      PT SHORT TERM GOAL #2   Title pt will report compliance with HEP as it has been established in the short term    Baseline began at eval    Time 3    Period Weeks    Status New    Target Date 02/17/20      PT SHORT TERM GOAL #3   Title Pt will understand use of socks and shoes for support while she is doing demanding activities in standing    Baseline began educating at eval    Time 3    Period Weeks    Status New    Target Date 02/17/20                     Plan - 01/25/20 1502    Clinical Impression Statement Pt presents to PT with complaints of bilateral knee pain as well as bilateral knee and ankle swelling. Began around 2016 when she fell at work, injuring her ankle, that began affecting her knees. Both knees and ankles are swollen and we discussed using compression socks and elevation for management. She has gross strength and flexibility deficits that she will benefit from treatment to address.    Personal Factors and Comorbidities Comorbidity 2;Time since onset of injury/illness/exacerbation;Fitness    Comorbidities anxiety, cultural demand for household chores    Examination-Activity Limitations Sit;Caring for Others;Carry;Squat;Stairs;Dressing;Stand;Locomotion Level    Examination-Participation Restrictions Cleaning;Meal Prep;Community Activity;Shop    Stability/Clinical Decision Making Evolving/Moderate complexity    Clinical Decision Making Moderate    Rehab Potential Good    PT Frequency --   3 visits in  first auth followed by  2/week for 6 weeks   PT Duration --   9 weeks   PT Treatment/Interventions ADLs/Self Care Home Management;Cryotherapy;Electrical Stimulation;Traction;Moist Heat;Iontophoresis 4mg /ml Dexamethasone;Stair training;Functional mobility training;Therapeutic activities;Therapeutic exercise;Neuromuscular re-education;Manual techniques;Patient/family education;Passive range of motion;Dry needling;Taping;Vasopneumatic Device    PT Next Visit Plan did she get socks? gross strengthening, core activation    PT Home Exercise Plan    Consulted and Agree with Plan of Care Patient           Patient will benefit from skilled therapeutic intervention in order to improve the following deficits and impairments:  Decreased range of motion, Difficulty walking, Increased muscle spasms, Decreased endurance, Decreased activity tolerance, Pain, Improper body mechanics, Impaired flexibility, Decreased strength, Increased edema  Visit Diagnosis: Chronic pain of left knee - Plan: PT plan of care cert/re-cert  Chronic pain of right knee - Plan: PT plan of care cert/re-cert  Localized edema - Plan: PT plan of care cert/re-cert  Muscle weakness (generalized) - Plan: PT plan of care cert/re-cert     Problem List Patient Active Problem List   Diagnosis Date Noted  . Bilateral knee pain 01/04/2020  . Chronic tension headaches 08/22/2019  . Bilateral headache 03/18/2019  . Vitamin D deficiency 03/18/2019  . COVID-19 virus detected 03/18/2019   Gaje Tennyson C. Alleta Avery PT, DPT 01/25/20 4:44 PM   [facility] [address] [place], [place], [place] Phone: [phone]   Fax:  [phone]  Name: Joanna Ashley MRN: [id number] Copas Date of Birth: [birth year]   Check all possible CPT codes:      [x]  97110 (Therapeutic Exercise)  []  92507 (SLP Treatment)  [x]  97112 (Neuro Re-ed)   []  92526 (Swallowing Treatment)   []  97116 (Gait Training)   []  [phone] (Cognitive  Training, 1st 15 minutes) [x]  97140 (Manual Therapy)   []  97130 (Cognitive Training, each add'l 15 minutes)  [x]  97530 (Therapeutic Activities)  []  Other, List CPT Code ____________    [x]  97535 (Self Care)       []  All codes above (97110 - 97535)  []  97012 (Mechanical Traction)  [x]  97014 (E-stim Unattended)  []  97032 (E-stim manual)  [x]  97033 (Ionto)  [x]  97035 (Ultrasound)  []  97760 (Orthotic Fit) []  (Physical Performance Training) []  (Aquatic Therapy) []  97034 (Contrast Bath) []  (Paraffin) []  97597 (Wound Care 1st 20 sq cm) []  97598 (Wound Care each add'l 20 sq cm)

## 2020-02-06 ENCOUNTER — Encounter: Payer: Self-pay | Admitting: Physical Therapy

## 2020-02-06 ENCOUNTER — Other Ambulatory Visit: Payer: Self-pay

## 2020-02-06 ENCOUNTER — Ambulatory Visit: Payer: Medicaid Other | Admitting: Physical Therapy

## 2020-02-06 DIAGNOSIS — R600 Localized edema: Secondary | ICD-10-CM

## 2020-02-06 DIAGNOSIS — M25562 Pain in left knee: Secondary | ICD-10-CM | POA: Diagnosis not present

## 2020-02-06 DIAGNOSIS — M6281 Muscle weakness (generalized): Secondary | ICD-10-CM | POA: Diagnosis not present

## 2020-02-06 DIAGNOSIS — G8929 Other chronic pain: Secondary | ICD-10-CM

## 2020-02-06 DIAGNOSIS — M25561 Pain in right knee: Secondary | ICD-10-CM | POA: Diagnosis not present

## 2020-02-06 NOTE — Patient Instructions (Signed)
Access Code: [email or web address] URL: [email or web address] Date: 02/06/2020 Prepared by: Jannette Spanner  Exercises Active Straight Leg Raise with Quad Set - 2 x daily - 7 x weekly - 2 sets - 10 reps Sidelying Hip Abduction - 2 x daily - 7 x weekly - 2 sets - 10 reps

## 2020-02-06 NOTE — Therapy (Signed)
[facility] Outpatient Rehabilitation [facility] [address] [place], [place], [address] Phone: [phone]   Fax:  [phone]  Physical Therapy Treatment  Patient Details  Name: Anitta Tenny MRN: [id number] Date of Birth: 1985/11/09 Referring Provider (PT): Carney Living, MD   Encounter Date: 02/06/2020   PT End of Session - 02/06/20 0821    Visit Number 2    Number of Visits 4    Authorization Type [employer]- auth submitted 7/14    PT Start Time 0815   pt arrived late   PT Stop Time 0845    PT Time Calculation (min) 30 min           Past Medical History:  Diagnosis Date  . Anxiety     History reviewed. No pertinent surgical history.  There were no vitals filed for this visit.   Subjective Assessment - 02/06/20 0819    Subjective Pt arrives 15 minutes late. Interpreter required increased time to set up.    Patient is accompained by: Interpreter   T[place]ian, audio interpreter used on Ipad   Currently in Pain? Yes    Pain Location Knee    Pain Orientation Right;Left    Pain Descriptors / Indicators Burning    Aggravating Factors  standing    Pain Relieving Factors rest, not wear shoes, ice water on feet                             [facility] Adult PT Treatment/Exercise - 02/06/20 0001      Knee/Hip Exercises: Stretches   Active Hamstring Stretch 2 reps    Active Hamstring Stretch Limitations 30 sec, edge of mat       Knee/Hip Exercises: Seated   Long Arc Quad 20 reps    Long Arc Quad Limitations added ball squeeze     Sit to [employer] 10 reps   audible crepitus     Knee/Hip Exercises: Supine   Quad Sets 10 reps    Straight Leg Raises 10 reps      Knee/Hip Exercises: Sidelying   Hip ABduction 10 reps    Hip ABduction Limitations 2 sets                  PT Education - 02/06/20 0841    Education Details HEP    Person(s) Educated Patient    Methods Explanation;Handout    Comprehension Verbalized  understanding            PT Short Term Goals - 01/25/20 1505      PT SHORT TERM GOAL #1   Title Pt will demo gross strength 4+/5    Baseline see flowsheet    Time 3    Period Weeks    Status New    Target Date 02/17/20      PT SHORT TERM GOAL #2   Title pt will report compliance with HEP as it has been established in the short term    Baseline began at eval    Time 3    Period Weeks    Status New    Target Date 02/17/20      PT SHORT TERM GOAL #3   Title Pt will understand use of socks and shoes for support while she is doing demanding activities in standing    Baseline began educating at eval    Time 3    Period Weeks    Status New  Target Date 02/17/20                    Plan - 02/06/20 0841    Clinical Impression Statement Pt reports she went to [facility] but did not find compression socks. Reviewed pictures online of the options she should be able to find there and also suggested she try medical supply if able.Reviewed HEP. She had audible crepitus with sit-stands. She demonstrates lateral tracking patellas with quad activation. Progressed HEP.    PT Next Visit Plan did she get socks? gross strengthening, core activation, consider patella tracking tape    PT Home Exercise Plan [id number], added [id number] SLR and side hip abduction           Patient will benefit from skilled therapeutic intervention in order to improve the following deficits and impairments:  Decreased range of motion, Difficulty walking, Increased muscle spasms, Decreased endurance, Decreased activity tolerance, Pain, Improper body mechanics, Impaired flexibility, Decreased strength, Increased edema  Visit Diagnosis: Chronic pain of left knee  Chronic pain of right knee  Localized edema  Muscle weakness (generalized)     Problem List Patient Active Problem List   Diagnosis Date Noted  . Bilateral knee pain 01/04/2020  . Chronic tension headaches 08/22/2019  . Bilateral headache  03/18/2019  . Vitamin D deficiency 03/18/2019  . COVID-19 virus detected 03/18/2019    Sherrie Mustache, PTA 02/06/2020, 9:45 AM  [facility] [address] [place], [place], [address] Phone: [phone]   Fax:  [phone]  Name: Jorryn Hershberger MRN: [id number] Date of Birth: March 02, 1986

## 2020-02-13 ENCOUNTER — Telehealth: Payer: Self-pay | Admitting: Family Medicine

## 2020-02-13 ENCOUNTER — Other Ambulatory Visit: Payer: Self-pay

## 2020-02-13 ENCOUNTER — Ambulatory Visit: Payer: Medicaid Other | Attending: Family Medicine | Admitting: Physical Therapy

## 2020-02-13 ENCOUNTER — Encounter: Payer: Self-pay | Admitting: Physical Therapy

## 2020-02-13 DIAGNOSIS — M6281 Muscle weakness (generalized): Secondary | ICD-10-CM | POA: Diagnosis not present

## 2020-02-13 DIAGNOSIS — R600 Localized edema: Secondary | ICD-10-CM | POA: Diagnosis not present

## 2020-02-13 DIAGNOSIS — M25561 Pain in right knee: Secondary | ICD-10-CM | POA: Insufficient documentation

## 2020-02-13 DIAGNOSIS — M25562 Pain in left knee: Secondary | ICD-10-CM | POA: Insufficient documentation

## 2020-02-13 DIAGNOSIS — G8929 Other chronic pain: Secondary | ICD-10-CM | POA: Insufficient documentation

## 2020-02-13 NOTE — Therapy (Signed)
[facility] [address] [place], [place], [address] Phone: [phone]   Fax:  [phone]  Physical Therapy Treatment  Patient Details  Name: Adrien Dietzman MRN: [id number] Date of Birth: 03-31-1986 Referring Provider (PT): Carney Living, MD   Encounter Date: 02/13/2020   PT End of Session - 02/13/20 0943    Visit Number 3    Number of Visits 4    Authorization Type [employer]- auth submitted 7/14    Authorization Time Period 02/06/20-03/05/20, 26 visits    Authorization - Visit Number 2    Authorization - Number of Visits 26    PT Start Time 0933    PT Stop Time 1017    PT Time Calculation (min) 44 min           Past Medical History:  Diagnosis Date  . Anxiety     History reviewed. No pertinent surgical history.  There were no vitals filed for this visit.   Subjective Assessment - 02/13/20 0938    Subjective Saturday , sunday and today the knees are really painful and I could not go walking.    Patient is accompained by: Interpreter   [phone],  Magda   Currently in Pain? Yes    Pain Score 7     Pain Location Knee    Pain Orientation Right;Left    Pain Descriptors / Indicators Burning    Pain Radiating Towards ankles    Aggravating Factors  standing, and walking    Pain Relieving Factors rest                             [facility] Adult PT Treatment/Exercise - 02/13/20 0001      Knee/Hip Exercises: Seated   Long Arc Quad Right;Left;3 sets;10 reps    Long [employer] Limitations with ball squeeze 5 sec holds       Knee/Hip Exercises: Supine   Quad Sets 20 reps    [employer] Limitations with ball squeeze     Short Arc [employer] 10 reps    Short Arc Quad Sets Limitations with ball squeeze     Straight Leg Raises Right;Left;10 reps    Straight Leg Raises Limitations 2 sets     Patellar Mobs patell amobs       Knee/Hip Exercises: Sidelying   Hip ABduction Right;Left;20 reps      Manual  Therapy   Manual Therapy Taping    McConnell tape to decrease lateral tracking bilateral knees- medial pull                    PT Short Term Goals - 01/25/20 1505      PT SHORT TERM GOAL #1   Title Pt will demo gross strength 4+/5    Baseline see flowsheet    Time 3    Period Weeks    Status New    Target Date 02/17/20      PT SHORT TERM GOAL #2   Title pt will report compliance with HEP as it has been established in the short term    Baseline began at eval    Time 3    Period Weeks    Status New    Target Date 02/17/20      PT SHORT TERM GOAL #3   Title Pt will understand use of socks and shoes for support while she is doing demanding activities in standing  Baseline began educating at eval    Time 3    Period Weeks    Status New    Target Date 02/17/20                    Plan - 02/13/20 0945    Clinical Impression Statement Pt reports she found compression socks but they cause throbbing and pain. She notes increased pain starting 2 days ago preventing her from going for her walk. Continued with LE strength and VMO activation. Trial of Mcconnel tape to decrease lateral tacking. No immediate benefit noted with sit-stands with tape applied. Asked her to remove tomorow evening or if it becomes painful prior.    PT Next Visit Plan gross strengthening, core activation, consider patella tracking tape    PT Home Exercise Plan [id number], added [id number] and side hip abduction           Patient will benefit from skilled therapeutic intervention in order to improve the following deficits and impairments:  Decreased range of motion, Difficulty walking, Increased muscle spasms, Decreased endurance, Decreased activity tolerance, Pain, Improper body mechanics, Impaired flexibility, Decreased strength, Increased edema  Visit Diagnosis: Chronic pain of left knee  Chronic pain of right knee  Localized edema  Muscle weakness (generalized)     Problem  List Patient Active Problem List   Diagnosis Date Noted  . Bilateral knee pain 01/04/2020  . Chronic tension headaches 08/22/2019  . Bilateral headache 03/18/2019  . Vitamin D deficiency 03/18/2019  . COVID-19 virus detected 03/18/2019    Sherrie Mustache, PTA 02/13/2020, 10:40 AM  [facility] [address] [place], [place], [address] Phone: [phone]   Fax:  [phone]  Name: Tonyetta Berko MRN: [id number] Date of Birth: Jun 21, 1986

## 2020-02-13 NOTE — Telephone Encounter (Signed)
Called patient using tigrinian interpreter to inform of August 17th appointment at 11:10 AM.  [name], DO

## 2020-02-17 ENCOUNTER — Ambulatory Visit: Payer: Medicaid Other | Admitting: Physical Therapy

## 2020-02-17 ENCOUNTER — Other Ambulatory Visit: Payer: Self-pay

## 2020-02-17 ENCOUNTER — Encounter: Payer: Self-pay | Admitting: Physical Therapy

## 2020-02-17 DIAGNOSIS — M25561 Pain in right knee: Secondary | ICD-10-CM | POA: Diagnosis not present

## 2020-02-17 DIAGNOSIS — M25562 Pain in left knee: Secondary | ICD-10-CM

## 2020-02-17 DIAGNOSIS — R600 Localized edema: Secondary | ICD-10-CM

## 2020-02-17 DIAGNOSIS — M6281 Muscle weakness (generalized): Secondary | ICD-10-CM | POA: Diagnosis not present

## 2020-02-17 DIAGNOSIS — G8929 Other chronic pain: Secondary | ICD-10-CM | POA: Diagnosis not present

## 2020-02-17 NOTE — Therapy (Signed)
[place], [place], [address] Phone: [phone]   Fax:  [phone]  Physical Therapy Treatment/Recertification  Patient Details  Name: Joanna Ashley MRN: [id number] Date of Birth: 1986-05-23 Referring Provider (PT): Lind Covert, MD   Encounter Date: 02/17/2020   PT End of Session - 02/17/20 1008    Visit Number 4    Authorization Type [employer]- auth submitted 7/14    Authorization Time Period 02/06/20-03/05/20, 26 visits    Authorization - Visit Number 3    Authorization - Number of Visits 26    PT Start Time 1008   began billing at 1013, waiting for interpreter   PT Stop Time 1055    PT Time Calculation (min) 47 min    Activity Tolerance Patient tolerated treatment well    Behavior During Therapy [facility] for tasks assessed/performed           Past Medical History:  Diagnosis Date  . Anxiety     History reviewed. No pertinent surgical history.  There were no vitals filed for this visit.   Subjective Assessment - 02/17/20 1011    Subjective reports tape was helpful. it helped especially when I was sleeping. I am walking every AM for 1-1.5 hr which has helped to decrease the pain.    Patient Stated Goals housechores, cooking, stand for a long time    Currently in Pain? Yes    Pain Score 7     Pain Location Knee    Pain Orientation Right;Left    Pain Descriptors / Indicators Burning   hot   Aggravating Factors  standing, walking    Pain Relieving Factors cold water              [facility] PT Assessment - 02/17/20 0001      Assessment   Medical Diagnosis bil knee pain    Referring Provider (PT) Lind Covert, MD    Onset Date/Surgical Date --   2016     PROM   Right Ankle Dorsiflexion 8    Left Ankle Dorsiflexion 12      Strength   Right Hip Flexion 4+/5    Right Hip ABduction 4+/5    Left Hip Flexion 4/5    Left Hip ABduction 4-/5      Flexibility   Hamstrings Rt  80, Lt 70                         [facility] Adult PT Treatment/Exercise - 02/17/20 0001      Exercises   Exercises Knee/Hip      Knee/Hip Exercises: Standing   Heel Raises Both;20 reps    Hip Abduction Both;Knee straight;15 reps    Hip Extension Both;Knee straight;15 reps    Lateral Step Up Both;20 reps;Step Height: 4";Hand Hold: 2    Wall Squat 20 reps    Wall Squat Limitations quarter range      Manual Therapy   McConnell medial patellar pull bil                  PT Education - 02/17/20 1036    Education Details goals, progress, POC, exercise form/rationale, tape for training    Person(s) Educated Patient    Methods Explanation;Demonstration;Tactile cues;Verbal cues    Comprehension Verbalized understanding;Need further instruction;Returned demonstration;Verbal cues required;Tactile cues required            PT Short Term Goals - 02/17/20 1025  PT SHORT TERM GOAL #1   Title Pt will demo gross strength 4+/5    Baseline see flowsheet    Status Partially Met      PT SHORT TERM GOAL #2   Title pt will report compliance with HEP as it has been established in the short term    Baseline walking every morning, reports doing exercises    Status Achieved      PT SHORT TERM GOAL #3   Title Pt will understand use of socks and shoes for support while she is doing demanding activities in standing    Baseline still has significant pain in standing, socks created throbbing    Status Not Met             PT Long Term Goals - 02/17/20 1105      PT LONG TERM GOAL #1   Title Bilateral knee pain <= 5/10    Baseline 7/10 today    Time 3    Period Weeks    Status New    Target Date 03/08/20      PT LONG TERM GOAL #2   Title pt will be able to navigate stairs with reciprocal pattern    Baseline compensates due to pain    Time 3    Period Weeks    Status New    Target Date 03/08/20      PT LONG TERM GOAL #3   Title LE MMT all at least 4+/5     Baseline see flowsheet    Time 3    Period Weeks    Status New    Target Date 03/08/20                 Plan - 02/17/20 1101    Clinical Impression Statement Pt has made improvements in flexibility and strength but continues to have burning bilaterally below her knees. I am concerned that there may be neuropathy present as this burning is not changing. She reports that the muscles are improving but pain level is still at 7/10. Will continue through 8/26 as approved by insurance and will request further authorization at that point if appropriate. Pt will continue to benefit from skilled PT to continue progress that is being made.    PT Frequency 2x / week    PT Duration 3 weeks    PT Treatment/Interventions ADLs/Self Care Home Management;Cryotherapy;Electrical Stimulation;Traction;Moist Heat;Iontophoresis 26m/ml Dexamethasone;Stair training;Functional mobility training;Therapeutic activities;Therapeutic exercise;Neuromuscular re-education;Manual techniques;Patient/family education;Passive range of motion;Dry needling;Taping;Vasopneumatic Device    PT Next Visit Plan continue patellar taping, CKC strengthening    PT Home Exercise Plan [id number] added [id number] and side hip abduction    Consulted and Agree with Plan of Care Patient           Patient will benefit from skilled therapeutic intervention in order to improve the following deficits and impairments:  Decreased range of motion, Difficulty walking, Increased muscle spasms, Decreased endurance, Decreased activity tolerance, Pain, Improper body mechanics, Impaired flexibility, Decreased strength, Increased edema  Visit Diagnosis: Chronic pain of left knee - Plan: PT plan of care cert/re-cert  Chronic pain of right knee - Plan: PT plan of care cert/re-cert  Localized edema - Plan: PT plan of care cert/re-cert  Muscle weakness (generalized) - Plan: PT plan of care cert/re-cert     Problem List Patient Active Problem List     Diagnosis Date Noted  . Bilateral knee pain 01/04/2020  . Chronic tension headaches 08/22/2019  . Bilateral headache 03/18/2019  .  Vitamin D deficiency 03/18/2019  . COVID-19 virus detected 03/18/2019    Patsie Mccardle C. Aurianna Earlywine PT, DPT 02/17/20 11:09 AM   [facility] [address] [place], [place], [address] Phone: [phone]   Fax:  [phone]  Name: Laquida Cotrell MRN: [id number] Date of Birth: 1985/11/22

## 2020-02-20 ENCOUNTER — Ambulatory Visit: Payer: Medicaid Other | Admitting: Physical Therapy

## 2020-02-20 ENCOUNTER — Encounter: Payer: Self-pay | Admitting: Physical Therapy

## 2020-02-20 ENCOUNTER — Other Ambulatory Visit: Payer: Self-pay

## 2020-02-20 DIAGNOSIS — G8929 Other chronic pain: Secondary | ICD-10-CM

## 2020-02-20 DIAGNOSIS — M6281 Muscle weakness (generalized): Secondary | ICD-10-CM

## 2020-02-20 DIAGNOSIS — M25562 Pain in left knee: Secondary | ICD-10-CM | POA: Diagnosis not present

## 2020-02-20 DIAGNOSIS — R600 Localized edema: Secondary | ICD-10-CM | POA: Diagnosis not present

## 2020-02-20 DIAGNOSIS — M25561 Pain in right knee: Secondary | ICD-10-CM | POA: Diagnosis not present

## 2020-02-20 NOTE — Therapy (Signed)
[place], [place], [address] Phone: [phone]   Fax:  [phone]  Physical Therapy Treatment  Patient Details  Name: Joanna Ashley MRN: [id number] Date of Birth: 09-16-1985 Referring Provider (PT): Lind Covert, MD   Encounter Date: 02/20/2020   PT End of Session - 02/20/20 1503    Visit Number 5    Authorization Time Period 02/06/20-03/05/20, 26 visits    Authorization - Visit Number 4    Authorization - Number of Visits 26    PT Start Time 1500    PT Stop Time 1542    PT Time Calculation (min) 42 min    Activity Tolerance Patient tolerated treatment well    Behavior During Therapy [facility] for tasks assessed/performed           Past Medical History:  Diagnosis Date  . Anxiety     History reviewed. No pertinent surgical history.  There were no vitals filed for this visit.                      [place] Adult PT Treatment/Exercise - 02/20/20 0001      Knee/Hip Exercises: Stretches   Passive Hamstring Stretch Both;30 seconds    Passive Hamstring Stretch Limitations supine with strap    Piriformis Stretch Limitations hooklying press      Knee/Hip Exercises: Machines for Strengthening   Total Gym Leg Press two feet, 1 plate      Knee/Hip Exercises: Standing   Heel Raises Both;20 reps;10 reps    Heel Raises Limitations edge of step      Knee/Hip Exercises: Supine   Short Arc Quad Sets Limitations after patellar taping, 2.5 lb on each ankle, 3s holds    Bridges Both;15 reps    Straight Leg Raises Strengthening;Both;10 reps    Straight Leg Raises Limitations 2.5 lb    Straight Leg Raise with External Rotation Both;10 reps    Straight Leg Raise with External Rotation Limitations 2.5 lb      Manual Therapy   McConnell medial patellar pull bil                    PT Short Term Goals - 02/17/20 1025      PT SHORT TERM GOAL #1   Title Pt will demo gross strength  4+/5    Baseline see flowsheet    Status Partially Met      PT SHORT TERM GOAL #2   Title pt will report compliance with HEP as it has been established in the short term    Baseline walking every morning, reports doing exercises    Status Achieved      PT SHORT TERM GOAL #3   Title Pt will understand use of socks and shoes for support while she is doing demanding activities in standing    Baseline still has significant pain in standing, socks created throbbing    Status Not Met             PT Long Term Goals - 02/17/20 1105      PT LONG TERM GOAL #1   Title Bilateral knee pain <= 5/10    Baseline 7/10 today    Time 3    Period Weeks    Status New    Target Date 03/08/20      PT LONG TERM GOAL #2   Title pt will be able to navigate stairs with reciprocal pattern  Baseline compensates due to pain    Time 3    Period Weeks    Status New    Target Date 03/08/20      PT LONG TERM GOAL #3   Title LE MMT all at least 4+/5    Baseline see flowsheet    Time 3    Period Weeks    Status New    Target Date 03/08/20                 Plan - 02/20/20 1523    Clinical Impression Statement Placed tape at the beginning of the session to train patellar pull in increased challanges. Pt did not have pain in her knees but was fatigued. Advised tht the soreness is to be expected as she is getting stronger.    PT Treatment/Interventions ADLs/Self Care Home Management;Cryotherapy;Electrical Stimulation;Traction;Moist Heat;Iontophoresis 45m/ml Dexamethasone;Stair training;Functional mobility training;Therapeutic activities;Therapeutic exercise;Neuromuscular re-education;Manual techniques;Patient/family education;Passive range of motion;Dry needling;Taping;Vasopneumatic Device    PT Next Visit Plan gross strengthening, balance exercises    PT Home Exercise Plan [id number] added [id number] and side hip abduction    Consulted and Agree with Plan of Care Patient           Patient  will benefit from skilled therapeutic intervention in order to improve the following deficits and impairments:  Decreased range of motion, Difficulty walking, Increased muscle spasms, Decreased endurance, Decreased activity tolerance, Pain, Improper body mechanics, Impaired flexibility, Decreased strength, Increased edema  Visit Diagnosis: Chronic pain of left knee  Chronic pain of right knee  Localized edema  Muscle weakness (generalized)     Problem List Patient Active Problem List   Diagnosis Date Noted  . Bilateral knee pain 01/04/2020  . Chronic tension headaches 08/22/2019  . Bilateral headache 03/18/2019  . Vitamin D deficiency 03/18/2019  . COVID-19 virus detected 03/18/2019   Charlisa Cham C. Loras Grieshop PT, DPT 02/20/20 3:44 PM   [facility] [place] [address]Phone: [phone]  Fax:  [phone] Name: Joanna CoryellMRN: [id number] of Birth: [birth year]

## 2020-02-23 ENCOUNTER — Other Ambulatory Visit: Payer: Self-pay

## 2020-02-23 ENCOUNTER — Encounter: Payer: Self-pay | Admitting: Physical Therapy

## 2020-02-23 ENCOUNTER — Ambulatory Visit: Payer: Medicaid Other | Admitting: Physical Therapy

## 2020-02-23 DIAGNOSIS — M25562 Pain in left knee: Secondary | ICD-10-CM | POA: Diagnosis not present

## 2020-02-23 DIAGNOSIS — R600 Localized edema: Secondary | ICD-10-CM | POA: Diagnosis not present

## 2020-02-23 DIAGNOSIS — M6281 Muscle weakness (generalized): Secondary | ICD-10-CM

## 2020-02-23 DIAGNOSIS — M25561 Pain in right knee: Secondary | ICD-10-CM | POA: Diagnosis not present

## 2020-02-23 DIAGNOSIS — G8929 Other chronic pain: Secondary | ICD-10-CM | POA: Diagnosis not present

## 2020-02-23 NOTE — Patient Instructions (Signed)
8 Food Ingredients That Can Cause Inflammation  .8 Food Ingredients That Can Cause Inflammation. When you have arthritis, your body is in an inflammatory state. ...  .Sugar. ...  .Saturated Fats. ...  .Trans Fats. ...  .Omega 6 Fatty Acids. ...  .Refined Carbohydrates. ...  .MSG. ...  .Gluten and Casein.  An anti-inflammatory diet should include these foods:  tomatoes olive oil green leafy vegetables, such as spinach, kale, and collards nuts like almonds and walnuts fatty fish like salmon, mackerel, tuna, and sardines fruits such as strawberries, blueberries, cherries, and oranges 

## 2020-02-23 NOTE — Therapy (Signed)
[place], [place], [address] Phone: [phone]   Fax:  [phone]  Physical Therapy Treatment  Patient Details  Name: Joanna Ashley MRN: [id number] Date of Birth: 18-Feb-1986 Referring Provider (PT): Lind Covert, MD   Encounter Date: 02/23/2020   PT End of Session - 02/23/20 1315    Visit Number 6    Number of Visits 10    Date for PT Re-Evaluation 04/29/20    Authorization Type [employer]- auth submitted 7/14    Authorization Time Period 02/06/20-03/05/20, 21 visits (84 units)    Authorization - Visit Number 5    Authorization - Number of Visits 26    PT Start Time 0814    PT Stop Time 1400    PT Time Calculation (min) 45 min           Past Medical History:  Diagnosis Date  . Anxiety     History reviewed. No pertinent surgical history.  There were no vitals filed for this visit.   Subjective Assessment - 02/23/20 1314    Subjective I have had a bad pain in both knees down the outside of my legs.    Currently in Pain? Yes    Pain Score 7                              [facility] Adult PT Treatment/Exercise - 02/23/20 0001      Knee/Hip Exercises: Stretches   Hip Flexor Stretch Limitations modified thomas bilateral       Knee/Hip Exercises: Aerobic   Recumbent Bike L2 x 5 minutes       Knee/Hip Exercises: Seated   Long Arc Quad Right;Left;3 sets;10 reps    Long Arc Quad Limitations with ball squeeze 5 sec holds       Knee/Hip Exercises: Supine   Quad Sets 20 reps    [employer] Limitations with ball squeeze     Short Arc Quad Sets Limitations with ball squeeze     Straight Leg Raises Strengthening;Both;10 reps    Straight Leg Raises Limitations 2.5 lb    Straight Leg Raise with External Rotation Both;10 reps    Straight Leg Raise with External Rotation Limitations 2.5 lb      Knee/Hip Exercises: Sidelying   Hip ABduction Right;Left;20 reps    Hip ABduction  Limitations 2.5 lbs      Manual Therapy   McConnell medial patellar pull bil   seated with pt instruction,                  PT Education - 02/23/20 1341    Education Details Anti inflammatory foods    Person(s) Educated Patient    Methods Explanation;Handout    Comprehension Verbalized understanding            PT Short Term Goals - 02/17/20 1025      PT SHORT TERM GOAL #1   Title Pt will demo gross strength 4+/5    Baseline see flowsheet    Status Partially Met      PT SHORT TERM GOAL #2   Title pt will report compliance with HEP as it has been established in the short term    Baseline walking every morning, reports doing exercises    Status Achieved      PT SHORT TERM GOAL #3   Title Pt will understand use of socks and shoes for support  while she is doing demanding activities in standing    Baseline still has significant pain in standing, socks created throbbing    Status Not Met             PT Long Term Goals - 02/17/20 1105      PT LONG TERM GOAL #1   Title Bilateral knee pain <= 5/10    Baseline 7/10 today    Time 3    Period Weeks    Status New    Target Date 03/08/20      PT LONG TERM GOAL #2   Title pt will be able to navigate stairs with reciprocal pattern    Baseline compensates due to pain    Time 3    Period Weeks    Status New    Target Date 03/08/20      PT LONG TERM GOAL #3   Title LE MMT all at least 4+/5    Baseline see flowsheet    Time 3    Period Weeks    Status New    Target Date 03/08/20                 Plan - 02/23/20 1416    Clinical Impression Statement Pt reports increased pain in last 2 days. Provided instruction on applying tape and where to purchase. Also discussed anti inflammatory foods.    PT Treatment/Interventions ADLs/Self Care Home Management;Cryotherapy;Electrical Stimulation;Traction;Moist Heat;Iontophoresis 42m/ml Dexamethasone;Stair training;Functional mobility training;Therapeutic  activities;Therapeutic exercise;Neuromuscular re-education;Manual techniques;Patient/family education;Passive range of motion;Dry needling;Taping;Vasopneumatic Device    PT Next Visit Plan gross strengthening, balance exercises    PT Home Exercise Plan [id number] added [id number] and side hip abduction           Patient will benefit from skilled therapeutic intervention in order to improve the following deficits and impairments:  Decreased range of motion, Difficulty walking, Increased muscle spasms, Decreased endurance, Decreased activity tolerance, Pain, Improper body mechanics, Impaired flexibility, Decreased strength, Increased edema  Visit Diagnosis: Chronic pain of left knee  Chronic pain of right knee  Localized edema  Muscle weakness (generalized)     Problem List Patient Active Problem List   Diagnosis Date Noted  . Bilateral knee pain 01/04/2020  . Chronic tension headaches 08/22/2019  . Bilateral headache 03/18/2019  . Vitamin D deficiency 03/18/2019  . COVID-19 virus detected 03/18/2019    DDorene Ar PTA 02/23/2020, 2:19 PM  [facility]G[place] [address]Phone: [phone]  Fax:  [phone] Name: Joanna WilcockMRN: [id number] of Birth: 111-07-1985

## 2020-02-28 ENCOUNTER — Encounter: Payer: Self-pay | Admitting: Family Medicine

## 2020-02-28 ENCOUNTER — Ambulatory Visit: Payer: Medicaid Other | Admitting: Family Medicine

## 2020-02-28 ENCOUNTER — Other Ambulatory Visit: Payer: Self-pay

## 2020-02-28 VITALS — BP 110/65 | HR 87 | Ht 64.0 in | Wt 184.0 lb

## 2020-02-28 DIAGNOSIS — G44229 Chronic tension-type headache, not intractable: Secondary | ICD-10-CM

## 2020-02-28 DIAGNOSIS — G8929 Other chronic pain: Secondary | ICD-10-CM | POA: Diagnosis not present

## 2020-02-28 DIAGNOSIS — Z124 Encounter for screening for malignant neoplasm of cervix: Secondary | ICD-10-CM | POA: Diagnosis not present

## 2020-02-28 DIAGNOSIS — M25562 Pain in left knee: Secondary | ICD-10-CM | POA: Diagnosis not present

## 2020-02-28 DIAGNOSIS — R6889 Other general symptoms and signs: Secondary | ICD-10-CM

## 2020-02-28 DIAGNOSIS — M25561 Pain in right knee: Secondary | ICD-10-CM

## 2020-02-28 DIAGNOSIS — R519 Headache, unspecified: Secondary | ICD-10-CM | POA: Diagnosis not present

## 2020-02-28 DIAGNOSIS — E559 Vitamin D deficiency, unspecified: Secondary | ICD-10-CM

## 2020-02-28 DIAGNOSIS — Z0000 Encounter for general adult medical examination without abnormal findings: Secondary | ICD-10-CM | POA: Diagnosis not present

## 2020-02-28 NOTE — Patient Instructions (Signed)
Wonderful to see you!   Continue taking your amitriptyline on a nightly basis.  We will check your thyroid and blood count today to make sure that everything is not contributing to your sensation of heat.

## 2020-02-28 NOTE — Progress Notes (Signed)
    SUBJECTIVE:   CHIEF COMPLAINT / HPI: Follow up   Tigrinian interpreter via iPad used for duration of visit.  Joanna Ashley is a 34 year old female presenting for follow-up of the following:  Vitamin D deficiency: Reports completed 50,000 IU weekly after 5 weeks, finished a few weeks ago.  Bilateral knee pains: Last seen in 12/2019 for this complaint.  Started physical therapy in July and reports she has been doing substantially better with this.  Has a few sessions left and will take exercises at home to continue doing afterwards.  Heat intolerance: Reports she has noticed for "a while" that she will intermittently feel like her skin is more warm than usual at random times during the day.  No night sweats.  Denies any associated fever, unexpected weight loss, malaise.  Has been washing with cool water to help cool her skin.  Otherwise feels at baseline.  Still has menstrual cycle, chronically irregular.  Currently on amitriptyline for headaches, intermittently misses doses.  She also has not had a Pap smear since she has been in the [place] and requests a referral to OB/GYN for pelvic exam and discuss her cycles as above.  PERTINENT  PMH / PSH: Chronic tension headaches, vitamin D deficiency, Covid in 11/2018  OBJECTIVE:   BP 110/65   Pulse 87   Ht 5\' 4"  (1.626 m)   Wt 184 lb (83.5 kg)   SpO2 98%   BMI 31.58 kg/m   General: Alert, NAD HEENT: NCAT, MMM, no anterior/posterior cervical lymphadenopathy palpated Cardiac: RRR  Lungs: Clear bilaterally, no increased WOB  Msk: Moves all extremities spontaneously, normal gait with 5/5 strength in bilateral lower extremity. Ext: Warm, dry, 2+ distal pulses, no edema bilaterally  ASSESSMENT/PLAN:   Vitamin D deficiency Recent completed weekly 5000 U x 5 weeks. recheck today.  Heat intolerance Chronic, unclear etiology and may be side effect 2/2 tricyclic antidepressant use.  Will check TSH and CBC today to rule out contributing thyroid  dysfunction or anemia.  No associated night sweats, fever, or unexpected weight loss to suggest chronic infectious or malignant etiology at this time.  Previous RPR, HIV negative in 2020.  Encouraged maintaining adequate hydration, keeping appropriate house temperature, and monitoring frequency.  Chronic tension headaches Well-managed on amitriptyline.  Continue nightly use as is.  Bilateral knee pain Substantially improved with PT.  Continue working with PT and exercises following completion.  Ice/heat as needed with Tylenol.  Healthcare maintenance Due for gynecological exam/Pap smear and prefers an OB/GYN provider for this.  Will place referral to OB/GYN.    Additionally brought in overdue bill via [place], provided with billing department phone number to contact for further discussion.  Follow-up in 1 month for above or sooner if needed.  2021, DO [facility]

## 2020-02-29 ENCOUNTER — Encounter: Payer: Self-pay | Admitting: Family Medicine

## 2020-02-29 ENCOUNTER — Ambulatory Visit: Payer: Medicaid Other | Admitting: Family Medicine

## 2020-02-29 DIAGNOSIS — R6889 Other general symptoms and signs: Secondary | ICD-10-CM | POA: Insufficient documentation

## 2020-02-29 DIAGNOSIS — Z0000 Encounter for general adult medical examination without abnormal findings: Secondary | ICD-10-CM | POA: Insufficient documentation

## 2020-02-29 LAB — CBC WITH DIFFERENTIAL/PLATELET
Basophils Absolute: 0 10*3/uL (ref 0.0–0.2)
Basos: 0 %
EOS (ABSOLUTE): 0.1 10*3/uL (ref 0.0–0.4)
Eos: 1 %
Hematocrit: 38.3 % (ref 34.0–46.6)
Hemoglobin: 13.3 g/dL (ref 11.1–15.9)
Immature Grans (Abs): 0 10*3/uL (ref 0.0–0.1)
Immature Granulocytes: 0 %
Lymphocytes Absolute: 2.6 10*3/uL (ref 0.7–3.1)
Lymphs: 34 %
MCH: 30.9 pg (ref 26.6–33.0)
MCHC: 34.7 g/dL (ref 31.5–35.7)
MCV: 89 fL (ref 79–97)
Monocytes Absolute: 0.7 10*3/uL (ref 0.1–0.9)
Monocytes: 9 %
Neutrophils Absolute: 4.4 10*3/uL (ref 1.4–7.0)
Neutrophils: 56 %
Platelets: 374 10*3/uL (ref 150–450)
RBC: 4.31 x10E6/uL (ref 3.77–5.28)
RDW: 12.1 % (ref 11.7–15.4)
WBC: 7.8 10*3/uL (ref 3.4–10.8)

## 2020-02-29 LAB — TSH: TSH: 1.02 u[IU]/mL (ref 0.450–4.500)

## 2020-02-29 LAB — VITAMIN D 25 HYDROXY (VIT D DEFICIENCY, FRACTURES): Vit D, 25-Hydroxy: 27.4 ng/mL — ABNORMAL LOW (ref 30.0–100.0)

## 2020-02-29 MED ORDER — AMITRIPTYLINE HCL 25 MG PO TABS: 25.0000 mg | ORAL_TABLET | Freq: Every day | ORAL | 4 refills | Status: DC

## 2020-02-29 NOTE — Assessment & Plan Note (Signed)
Substantially improved with PT.  Continue working with PT and exercises following completion.  Ice/heat as needed with Tylenol.

## 2020-02-29 NOTE — Assessment & Plan Note (Signed)
Well-managed on amitriptyline.  Continue nightly use as is.

## 2020-02-29 NOTE — Assessment & Plan Note (Addendum)
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  Will check TSH and CBC today to rule out contributing thyroid dysfunction or anemia.  No associated night sweats, fever, or unexpected weight loss to suggest chronic infectious or malignant etiology at this time.  Previous RPR, HIV negative in 2020.  Encouraged maintaining adequate hydration, keeping appropriate house temperature, and monitoring frequency.

## 2020-02-29 NOTE — Assessment & Plan Note (Signed)
Recent completed weekly 5000 U x 5 weeks. recheck today.

## 2020-02-29 NOTE — Assessment & Plan Note (Addendum)
Due for gynecological exam/Pap smear and prefers an OB/GYN provider for this.  Will place referral to OB/GYN.

## 2020-03-01 ENCOUNTER — Other Ambulatory Visit: Payer: Self-pay

## 2020-03-01 ENCOUNTER — Encounter: Payer: Self-pay | Admitting: Physical Therapy

## 2020-03-01 ENCOUNTER — Ambulatory Visit: Payer: Medicaid Other | Admitting: Physical Therapy

## 2020-03-01 DIAGNOSIS — R600 Localized edema: Secondary | ICD-10-CM

## 2020-03-01 DIAGNOSIS — M25562 Pain in left knee: Secondary | ICD-10-CM | POA: Diagnosis not present

## 2020-03-01 DIAGNOSIS — M6281 Muscle weakness (generalized): Secondary | ICD-10-CM

## 2020-03-01 DIAGNOSIS — M25561 Pain in right knee: Secondary | ICD-10-CM | POA: Diagnosis not present

## 2020-03-01 DIAGNOSIS — G8929 Other chronic pain: Secondary | ICD-10-CM | POA: Diagnosis not present

## 2020-03-01 NOTE — Therapy (Signed)
[place], [place], [address] Phone: [phone]   Fax:  [phone]  Physical Therapy Treatment  Patient Details  Name: Joanna Ashley MRN: [id number] Date of Birth: 24-Dec-1985 Referring Provider (PT): Lind Covert, MD   Encounter Date: 03/01/2020   PT End of Session - 03/01/20 1018    Visit Number 7    Number of Visits 10    Date for PT Re-Evaluation 04/29/20    Authorization Type [employer]- auth submitted 7/14    Authorization Time Period 02/06/20-03/05/20, 21 visits (84 units)    Authorization - Visit Number 6    Authorization - Number of Visits 21    PT Start Time 3976    PT Stop Time 1059    PT Time Calculation (min) 44 min           Past Medical History:  Diagnosis Date  . Anxiety     History reviewed. No pertinent surgical history.  There were no vitals filed for this visit.   Subjective Assessment - 03/01/20 1018    Subjective I am tired today. i have pain in knees and lower legs. I am walking every day at 6 am.    Patient is accompained by: Interpreter   audio interpreter- disconnected x 2   Currently in Pain? Yes    Pain Score 5     Pain Location Knee    Pain Orientation Right;Left    Pain Descriptors / Indicators Burning                             [facility] Adult PT Treatment/Exercise - 03/01/20 0001      Knee/Hip Exercises: Stretches   Other Knee/Hip Stretches slant board       Knee/Hip Exercises: Aerobic   Recumbent Bike L2 x 5 minutes       Knee/Hip Exercises: Standing   Heel Raises Both;20 reps    Forward Step Up 2 sets;10 reps;Hand Hold: 0;Step Height: 6"    Step Down 15 reps;Hand Hold: 1;Step Height: 4"    SLS >25 sec each     Gait Training up and down 12 steps without HR and good safety, min c/o anterior knee pain.       Knee/Hip Exercises: Seated   Sit to Sand 15 reps      Knee/Hip Exercises: Supine   Straight Leg Raises  Strengthening;Both;10 reps    Straight Leg Raises Limitations 2.5 lb    Straight Leg Raise with External Rotation Both;10 reps    Straight Leg Raise with External Rotation Limitations 2.5 lb      Knee/Hip Exercises: Sidelying   Hip ABduction Right;Left;20 reps    Hip ABduction Limitations 2.5 lbs      Manual Therapy   McConnell medial patellar pull bil   seated with pt instruction,                    PT Short Term Goals - 02/17/20 1025      PT SHORT TERM GOAL #1   Title Pt will demo gross strength 4+/5    Baseline see flowsheet    Status Partially Met      PT SHORT TERM GOAL #2   Title pt will report compliance with HEP as it has been established in the short term    Baseline walking every morning, reports doing exercises    Status Achieved  PT SHORT TERM GOAL #3   Title Pt will understand use of socks and shoes for support while she is doing demanding activities in standing    Baseline still has significant pain in standing, socks created throbbing    Status Not Met             PT Long Term Goals - 03/01/20 1052      PT LONG TERM GOAL #1   Title Bilateral knee pain <= 5/10    Baseline 5/10 today    Time 3    Period Weeks    Status Partially Met      PT LONG TERM GOAL #2   Title pt will be able to navigate stairs with reciprocal pattern    Time 3    Period Weeks    Status Achieved      PT LONG TERM GOAL #3   Title LE MMT all at least 4+/5    Time 3    Period Weeks    Status Unable to assess                 Plan - 03/01/20 1020    Clinical Impression Statement Pt reports she is better than before starting physical therapy but still has some pain. She likes the Patella tracking tape but has been unable to purchase her own. Reapplied tape and increased closed chain activites today. She was able to navigate reciprocal stairs x 12 without UE and good safety, c/o pain with ascending. LTG# 2 met. SLS >25 sec bilateral.    PT Next Visit Plan  gross strengthening, balance exercises    PT Home Exercise Plan [id number], added [id number] SLR and side hip abduction           Patient will benefit from skilled therapeutic intervention in order to improve the following deficits and impairments:  Decreased range of motion, Difficulty walking, Increased muscle spasms, Decreased endurance, Decreased activity tolerance, Pain, Improper body mechanics, Impaired flexibility, Decreased strength, Increased edema  Visit Diagnosis: Chronic pain of left knee  Chronic pain of right knee  Localized edema  Muscle weakness (generalized)     Problem List Patient Active Problem List   Diagnosis Date Noted  . Heat intolerance 02/29/2020  . Healthcare maintenance 02/29/2020  . Bilateral knee pain 01/04/2020  . Chronic tension headaches 08/22/2019  . Bilateral headache 03/18/2019  . Vitamin D deficiency 03/18/2019  . COVID-19 virus detected 03/18/2019    Dorene Ar, PTA 03/01/2020, 10:55 AM  [place], [place], [address] Phone: [phone]   Fax:  [phone]  Name: Kawehi Hostetter MRN: [id number] Date of Birth: 03-Jun-1986

## 2020-03-05 ENCOUNTER — Ambulatory Visit: Payer: Medicaid Other | Admitting: Physical Therapy

## 2020-03-05 ENCOUNTER — Other Ambulatory Visit: Payer: Self-pay

## 2020-03-05 ENCOUNTER — Encounter: Payer: Self-pay | Admitting: Physical Therapy

## 2020-03-05 DIAGNOSIS — R600 Localized edema: Secondary | ICD-10-CM | POA: Diagnosis not present

## 2020-03-05 DIAGNOSIS — G8929 Other chronic pain: Secondary | ICD-10-CM

## 2020-03-05 DIAGNOSIS — M25561 Pain in right knee: Secondary | ICD-10-CM | POA: Diagnosis not present

## 2020-03-05 DIAGNOSIS — M6281 Muscle weakness (generalized): Secondary | ICD-10-CM | POA: Diagnosis not present

## 2020-03-05 DIAGNOSIS — M25562 Pain in left knee: Secondary | ICD-10-CM | POA: Diagnosis not present

## 2020-03-05 NOTE — Therapy (Addendum)
[place], [place], [address] Phone: [phone]   Fax:  [phone]  Physical Therapy Treatment  Patient Details  Name: Joanna Ashley MRN: [id number] Date of Birth: 09/25/1985 Referring Provider (PT): Lind Covert, MD   Encounter Date: 03/05/2020   PT End of Session - 03/05/20 1238    Visit Number 8    Number of Visits 21    Date for PT Re-Evaluation 04/29/20    Authorization Type [employer]- auth submitted 7/14, re-auth submitted 8/23 to extend authorization period    Authorization Time Period 02/06/20-03/05/20, 21 visits (84 units)    Authorization - Visit Number 7    Authorization - Number of Visits 21    PT Start Time 1232    PT Stop Time 1315    PT Time Calculation (min) 43 min           Past Medical History:  Diagnosis Date  . Anxiety     History reviewed. No pertinent surgical history.  There were no vitals filed for this visit.   Subjective Assessment - 03/05/20 1236    Subjective I feel okay today, I had alot of pain yesterday and last night.    Currently in Pain? Yes    Pain Score 5     Pain Location Knee    Pain Orientation Left;Right    Pain Descriptors / Indicators Burning    Pain Type Chronic pain    Aggravating Factors  activity    Pain Relieving Factors tape              [facility] PT Assessment - 03/05/20 0001      Strength   Right Hip Flexion 4+/5    Right Hip Extension 4-/5    Right Hip ABduction 4+/5    Left Hip Flexion 4+/5    Left Hip Extension 4-/5    Left Hip ABduction 4+/5                         [facility] Adult PT Treatment/Exercise - 03/05/20 0001      Knee/Hip Exercises: Aerobic   Recumbent Bike L2 x 5 minutes       Knee/Hip Exercises: Standing   SLS >25 sec each     Gait Training up and down 16 steps without HR and good safety, min c/o anterior knee pain.           --      Knee/Hip Exercises: Sidelying   Hip ABduction  Right;Left;20 reps      Knee/Hip Exercises: Prone   Hip Extension 10 reps   2 sets   Hip Extension Limitations Rt/Lt                              PT Short Term Goals - 03/05/20 1248      PT SHORT TERM GOAL #1   Title Pt will demo gross strength 4+/5    Baseline see flowsheet, initial measures improved , hip flexion and abduction 4+/5    Time 3    Period Weeks    Status Achieved    Target Date 02/17/20      PT SHORT TERM GOAL #2   Title pt will report compliance with HEP as it has been established in the short term    Baseline walking every morning, reports doing exercises    Time 3  Period Weeks    Status Achieved    Target Date 02/17/20      PT SHORT TERM GOAL #3   Title Pt will understand use of socks and shoes for support while she is doing demanding activities in standing    Baseline pt reports she is wearing shoes more often now, has not noticed change in her pain as a result.    Time 3    Period Weeks    Status Partially Met    Target Date 02/17/20             PT Long Term Goals - 03/05/20 1250      PT LONG TERM GOAL #1   Title Bilateral knee pain <= 5/10    Baseline 5/10 today,  and last treatment, still has episodes of increased pain up to 10/10 at various times of day    Time 3    Period Weeks    Status On-going      PT LONG TERM GOAL #2   Title pt will be able to navigate stairs with reciprocal pattern    Baseline able to complete in clinic , unable when she has high episodes of pain per her report    Time 3    Period Weeks    Status Achieved      PT LONG TERM GOAL #3   Title LE MMT all at least 4+/5    Baseline see flowsheet    Time 3    Period Weeks    Status On-going                 Plan - 03/05/20 1257    Clinical Impression Statement Pt reports physical therapy is helping her pain. She likes the mcconnel tape applied to her patella's which decrease her pain with transitions and stairs.  Her gross hip strength  measured on evaluation has improved to 4+/5 and she is wearing socks and supportive shoes more often. She is completing her HEP. All STGs met or partially met. Her knee pain at last 2 session was reported at 5/10 which is improved from 7/10 on eval. She still has episodes of pain up to 10/10 per her reports and not consistent aggravating factors.She is able to climb recipocal stairs, 12 steps in clinic without Hand rail and good safety. LTG x 2 met. Pt will continue to benefit from skilled PT to continue progress that is being made.    Personal Factors and Comorbidities Comorbidity 2;Time since onset of injury/illness/exacerbation;Fitness    Comorbidities anxiety, cultural demand for household chores    Examination-Activity Limitations Sit;Caring for Others;Carry;Squat;Stairs;Dressing;Stand;Locomotion Level    Examination-Participation Restrictions Cleaning;Meal Prep;Community Activity;Shop    PT Frequency 2x / week    PT Duration 6 weeks    PT Treatment/Interventions ADLs/Self Care Home Management;Cryotherapy;Electrical Stimulation;Traction;Moist Heat;Iontophoresis 98m/ml Dexamethasone;Stair training;Functional mobility training;Therapeutic activities;Therapeutic exercise;Neuromuscular re-education;Manual techniques;Patient/family education;Passive range of motion;Dry needling;Taping;Vasopneumatic Device    PT Next Visit Plan gross strengthening, balance exercises, mcconnel tape PRN    PT Home Exercise Plan [id number] added [id number] and side hip abduction    Consulted and Agree with Plan of Care Patient           Patient will benefit from skilled therapeutic intervention in order to improve the following deficits and impairments:  Decreased range of motion, Difficulty walking, Increased muscle spasms, Decreased endurance, Decreased activity tolerance, Pain, Improper body mechanics, Impaired flexibility, Decreased strength, Increased edema  Visit Diagnosis: Chronic pain of left knee  Chronic  pain of right knee  Localized edema  Muscle weakness (generalized)  Check all possible CPT codes:      _0  97110 (Therapeutic Exercise)  _1  92507 (SLP Treatment)  _2  92230 (Neuro Re-ed)   _3  92526 (Swallowing Treatment)   _4  09794 (Gait Training)   _5  99718 (Cognitive Training, 1st 15 minutes) _6  97140 (Manual Therapy)   _7  97130 (Cognitive Training, each add'l 15 minutes)  _8  97530 (Therapeutic Activities)  _9  Other, List CPT Code ____________    _10  20990 (Self Care)       _11  All codes above (97110 - 97535)  _12  97012 (Mechanical Traction)  _13  97014 (E-stim Unattended)  _14  97032 (E-stim manual)  _15  97033 (Ionto)  _16  97035 (Ultrasound)  _17  97016 (Vaso)  _18  97760 (Orthotic Fit) _19  [id number] (Prosthetic Training) _20  [id number] (Physical Performance Training) _21  [id number] (Aquatic Therapy) _22  [id number] (Canalith Repositioning) _23  68934 (Contrast Bath) _24  06840 (Paraffin) _25  97597 (Wound Care 1st 20 sq cm) _26  97598 (Wound Care each add'l 20 sq cm)      Problem List Patient Active Problem List   Diagnosis Date Noted  . Heat intolerance 02/29/2020  . Healthcare maintenance 02/29/2020  . Bilateral knee pain 01/04/2020  . Chronic tension headaches 08/22/2019  . Bilateral headache 03/18/2019  . Vitamin D deficiency 03/18/2019  . COVID-19 virus detected 03/18/2019    Dorene Ar 03/05/2020, 2:11 PM  [place] [facility] [address] [place], [place], [address] Phone: [phone]   Fax:  [phone]  Name: Joanna Ashley MRN: [id number] Date of Birth: Nov 11, 1985

## 2020-03-07 ENCOUNTER — Other Ambulatory Visit: Payer: Self-pay

## 2020-03-07 ENCOUNTER — Encounter: Payer: Self-pay | Admitting: Family Medicine

## 2020-03-07 ENCOUNTER — Other Ambulatory Visit: Payer: Self-pay | Admitting: Family Medicine

## 2020-03-07 ENCOUNTER — Ambulatory Visit: Payer: Medicaid Other | Admitting: Physical Therapy

## 2020-03-07 ENCOUNTER — Encounter: Payer: Self-pay | Admitting: Physical Therapy

## 2020-03-07 DIAGNOSIS — R600 Localized edema: Secondary | ICD-10-CM

## 2020-03-07 DIAGNOSIS — G8929 Other chronic pain: Secondary | ICD-10-CM | POA: Diagnosis not present

## 2020-03-07 DIAGNOSIS — M6281 Muscle weakness (generalized): Secondary | ICD-10-CM

## 2020-03-07 DIAGNOSIS — M25562 Pain in left knee: Secondary | ICD-10-CM

## 2020-03-07 DIAGNOSIS — E559 Vitamin D deficiency, unspecified: Secondary | ICD-10-CM

## 2020-03-07 DIAGNOSIS — M25561 Pain in right knee: Secondary | ICD-10-CM | POA: Diagnosis not present

## 2020-03-07 MED ORDER — VITAMIN D-3 25 MCG (1000 UT) PO CAPS: 1.0000 | ORAL_CAPSULE | Freq: Every day | ORAL | 2 refills | Status: DC

## 2020-03-07 NOTE — Therapy (Signed)
[place], [place], [address] Phone: [phone]   Fax:  [phone]  Physical Therapy Treatment  Patient Details  Name: Joanna Ashley MRN: [id number] Date of Birth: [birth year] Referring Provider (PT): Lind Covert, MD   Encounter Date: 03/07/2020   PT End of Session - 03/07/20 1236    Visit Number 9    Number of Visits 21    Date for PT Re-Evaluation 04/29/20    Authorization Type [employer]- auth submitted 7/14, re-auth submitted 8/23 to extend authorization period    Authorization Time Period 02/06/20-03/05/20, 21 visits (84 units)    Authorization - Visit Number 8    Authorization - Number of Visits 21    PT Start Time 1230    PT Stop Time 1315    PT Time Calculation (min) 45 min           Past Medical History:  Diagnosis Date  . Anxiety     History reviewed. No pertinent surgical history.  There were no vitals filed for this visit.   Subjective Assessment - 03/07/20 1235    Subjective Knees were hurting last night and this morning.    Currently in Pain? Yes    Pain Score 7     Pain Location Knee    Pain Orientation Left;Right    Pain Descriptors / Indicators Burning    Pain Type Chronic pain    Aggravating Factors  evening, sleeping    Pain Relieving Factors tape                             [facility] Adult PT Treatment/Exercise - 03/07/20 0001      Knee/Hip Exercises: Stretches   Hip Flexor Stretch Limitations modified thomas bilateral    with strap      Knee/Hip Exercises: Standing   Heel Raises Both;20 reps    Lateral Step Up 10 reps;Hand Hold: 0;Step Height: 6"    Forward Step Up 2 sets;10 reps;Hand Hold: 0;Step Height: 6"    SLS >25 sec each       Knee/Hip Exercises: Seated   Sit to Sand 15 reps      Knee/Hip Exercises: Prone   Hip Extension 10 reps   2 sets   Hip Extension Limitations RT/LT    Other Prone Exercises donkey kicks x 10 each         Manual Therapy   McConnell medial patellar pull bil   seated with pt instruction,                    PT Short Term Goals - 03/05/20 1248      PT SHORT TERM GOAL #1   Title Pt will demo gross strength 4+/5    Baseline see flowsheet, initial measures improved , hip flexion and abduction 4+/5    Time 3    Period Weeks    Status Achieved    Target Date 02/17/20      PT SHORT TERM GOAL #2   Title pt will report compliance with HEP as it has been established in the short term    Baseline walking every morning, reports doing exercises    Time 3    Period Weeks    Status Achieved    Target Date 02/17/20      PT SHORT TERM GOAL #3   Title Pt will understand use of socks and shoes for  support while she is doing demanding activities in standing    Baseline pt reports she is wearing shoes more often now, has not noticed change in her pain as a result.    Time 3    Period Weeks    Status Partially Met    Target Date 02/17/20             PT Long Term Goals - 03/05/20 1250      PT LONG TERM GOAL #1   Title Bilateral knee pain <= 5/10    Baseline 5/10 today,  and last treatment, still has episodes of increased pain up to 10/10 at various times of day    Time 3    Period Weeks    Status On-going      PT LONG TERM GOAL #2   Title pt will be able to navigate stairs with reciprocal pattern    Baseline able to complete in clinic , unable when she has high episodes of pain per her report    Time 3    Period Weeks    Status Achieved      PT LONG TERM GOAL #3   Title LE MMT all at least 4+/5    Baseline see flowsheet    Time 3    Period Weeks    Status On-going                 Plan - 03/07/20 1325    Clinical Impression Statement Increased pain reported today however good tolerance to therex. Reapplied tape per pt request. Worked on hip extension strengthening.    PT Next Visit Plan gross strengthening, balance exercises, mcconnel tape PRN    PT Home  Exercise Plan [id number], added [id number] SLR and side hip abduction           Patient will benefit from skilled therapeutic intervention in order to improve the following deficits and impairments:  Decreased range of motion, Difficulty walking, Increased muscle spasms, Decreased endurance, Decreased activity tolerance, Pain, Improper body mechanics, Impaired flexibility, Decreased strength, Increased edema  Visit Diagnosis: Chronic pain of left knee  Chronic pain of right knee  Localized edema  Muscle weakness (generalized)     Problem List Patient Active Problem List   Diagnosis Date Noted  . Heat intolerance 02/29/2020  . Healthcare maintenance 02/29/2020  . Bilateral knee pain 01/04/2020  . Chronic tension headaches 08/22/2019  . Bilateral headache 03/18/2019  . Vitamin D deficiency 03/18/2019  . COVID-19 virus detected 03/18/2019    Dorene Ar, PTA 03/07/2020, 1:27 PM  [place], [place], [address] Phone: [phone]   Fax:  [phone]  Name: Ryelee Albee MRN: [id number] Date of Birth: Aug 21, 1985

## 2020-03-08 ENCOUNTER — Ambulatory Visit: Payer: Medicaid Other | Admitting: Physical Therapy

## 2020-03-13 ENCOUNTER — Encounter: Payer: Medicaid Other | Admitting: Physical Therapy

## 2020-03-15 ENCOUNTER — Other Ambulatory Visit: Payer: Self-pay

## 2020-03-15 ENCOUNTER — Encounter: Payer: Self-pay | Admitting: Physical Therapy

## 2020-03-15 ENCOUNTER — Ambulatory Visit: Payer: Medicaid Other | Attending: Family Medicine | Admitting: Physical Therapy

## 2020-03-15 DIAGNOSIS — M25561 Pain in right knee: Secondary | ICD-10-CM | POA: Diagnosis not present

## 2020-03-15 DIAGNOSIS — M6281 Muscle weakness (generalized): Secondary | ICD-10-CM | POA: Diagnosis not present

## 2020-03-15 DIAGNOSIS — R600 Localized edema: Secondary | ICD-10-CM | POA: Diagnosis not present

## 2020-03-15 DIAGNOSIS — G8929 Other chronic pain: Secondary | ICD-10-CM

## 2020-03-15 DIAGNOSIS — M25562 Pain in left knee: Secondary | ICD-10-CM

## 2020-03-15 NOTE — Therapy (Signed)
[place], [place], [address] Phone: [phone]   Fax:  [phone]  Physical Therapy Treatment  Patient Details  Name: Joanna Ashley MRN: [id number] Date of Birth: 1985/11/27 Referring Provider (PT): Lind Covert, MD   Encounter Date: 03/15/2020   PT End of Session - 03/15/20 1107    Visit Number 10    Number of Visits 21    Date for PT Re-Evaluation 04/29/20    Authorization Time Period 02/06/20-03/05/20, 21 visits (84 units), 03/07/20-03/30/20, 12 visits    Authorization - Visit Number 2    Authorization - Number of Visits 12    PT Start Time 1102    PT Stop Time 1142    PT Time Calculation (min) 40 min           Past Medical History:  Diagnosis Date  . Anxiety     History reviewed. No pertinent surgical history.  There were no vitals filed for this visit.   Subjective Assessment - 03/15/20 1107    Subjective The knees are 6/10, tape helped for 3 days. Pain returned on the weekend.    Patient is accompained by: Interpreter   Tesh   Currently in Pain? Yes    Pain Score 6     Pain Location Knee    Pain Orientation Left;Right    Pain Descriptors / Indicators Burning    Pain Type Chronic pain    Aggravating Factors  sometimes sleeping ,    Pain Relieving Factors tape                             [facility] Adult PT Treatment/Exercise - 03/15/20 0001      Knee/Hip Exercises: Stretches   Passive Hamstring Stretch Both;30 seconds    Passive Hamstring Stretch Limitations using hands     Hip Flexor Stretch Limitations modified thomas bilat     Other Knee/Hip Stretches slant board  60 sec       Knee/Hip Exercises: Aerobic   Recumbent Bike L2 x 6 minutes       Knee/Hip Exercises: Standing   Heel Raises Both;20 reps    Forward Step Up 20 reps;Hand Hold: 1;Step Height: 8"    Step Down 10 reps;Hand Hold: 1;Step Height: 4"    SLS >60 sec bilateral      Knee/Hip Exercises:  Seated   Sit to Sand 20 reps      Knee/Hip Exercises: Sidelying   Hip ABduction Right;Left;20 reps      Knee/Hip Exercises: Prone   Hip Extension 10 reps   2 sets   Hip Extension Limitations RT/LT    Other Prone Exercises donkey kicks x 10 each x 2       Manual Therapy   McConnell medial patellar pull bil   seated with pt instruction,                    PT Short Term Goals - 03/05/20 1248      PT SHORT TERM GOAL #1   Title Pt will demo gross strength 4+/5    Baseline see flowsheet, initial measures improved , hip flexion and abduction 4+/5    Time 3    Period Weeks    Status Achieved    Target Date 02/17/20      PT SHORT TERM GOAL #2   Title pt will report compliance with HEP as it has been established  in the short term    Baseline walking every morning, reports doing exercises    Time 3    Period Weeks    Status Achieved    Target Date 02/17/20      PT SHORT TERM GOAL #3   Title Pt will understand use of socks and shoes for support while she is doing demanding activities in standing    Baseline pt reports she is wearing shoes more often now, has not noticed change in her pain as a result.    Time 3    Period Weeks    Status Partially Met    Target Date 02/17/20             PT Long Term Goals - 03/05/20 1250      PT LONG TERM GOAL #1   Title Bilateral knee pain <= 5/10    Baseline 5/10 today,  and last treatment, still has episodes of increased pain up to 10/10 at various times of day    Time 3    Period Weeks    Status On-going      PT LONG TERM GOAL #2   Title pt will be able to navigate stairs with reciprocal pattern    Baseline able to complete in clinic , unable when she has high episodes of pain per her report    Time 3    Period Weeks    Status Achieved      PT LONG TERM GOAL #3   Title LE MMT all at least 4+/5    Baseline see flowsheet    Time 3    Period Weeks    Status On-going                 Plan - 03/15/20 1133     Clinical Impression Statement Pt reports tape was helpful for 3 days after last session and it also helps her sleep better. Encouraged her to purchase tape so she can self manage her pain. She plans to purchase tape today. Continued LE strength and balance, progressing toward remaining LTGS.  SLS improved to 60 sec bilateral.    PT Next Visit Plan gross strengthening, increase balance challenges, mcconnel tape PRN    PT Home Exercise Plan [id number], added [id number] SLR and side hip abduction           Patient will benefit from skilled therapeutic intervention in order to improve the following deficits and impairments:  Decreased range of motion, Difficulty walking, Increased muscle spasms, Decreased endurance, Decreased activity tolerance, Pain, Improper body mechanics, Impaired flexibility, Decreased strength, Increased edema  Visit Diagnosis: Chronic pain of left knee  Chronic pain of right knee  Localized edema  Muscle weakness (generalized)     Problem List Patient Active Problem List   Diagnosis Date Noted  . Heat intolerance 02/29/2020  . Healthcare maintenance 02/29/2020  . Bilateral knee pain 01/04/2020  . Chronic tension headaches 08/22/2019  . Bilateral headache 03/18/2019  . Vitamin D deficiency 03/18/2019  . COVID-19 virus detected 03/18/2019    Dorene Ar, PTA 03/15/2020, 11:52 AM  [facility] [address] [place], [place], [address] Phone: [phone]   Fax:  [phone]  Name: Murle Hellstrom MRN: [id number] Date of Birth: 1986/07/11

## 2020-03-21 ENCOUNTER — Other Ambulatory Visit: Payer: Self-pay

## 2020-03-21 ENCOUNTER — Encounter: Payer: Self-pay | Admitting: Physical Therapy

## 2020-03-21 ENCOUNTER — Ambulatory Visit: Payer: Medicaid Other | Admitting: Physical Therapy

## 2020-03-21 DIAGNOSIS — M6281 Muscle weakness (generalized): Secondary | ICD-10-CM

## 2020-03-21 DIAGNOSIS — G8929 Other chronic pain: Secondary | ICD-10-CM

## 2020-03-21 DIAGNOSIS — M25562 Pain in left knee: Secondary | ICD-10-CM

## 2020-03-21 DIAGNOSIS — R600 Localized edema: Secondary | ICD-10-CM

## 2020-03-21 DIAGNOSIS — M25561 Pain in right knee: Secondary | ICD-10-CM | POA: Diagnosis not present

## 2020-03-21 NOTE — Therapy (Signed)
[place], [place], [address] Phone: [phone]   Fax:  [phone]  Physical Therapy Treatment  Patient Details  Name: Joanna Ashley MRN: [id number] Date of Birth: 09-15-1985 Referring Provider (PT): Lind Covert, MD   Encounter Date: 03/21/2020   PT End of Session - 03/21/20 1202    Visit Number 11    Number of Visits 21    Date for PT Re-Evaluation 04/29/20    Authorization Type [employer]- auth submitted 7/14, re-auth submitted 8/23 to extend authorization period    Authorization Time Period 02/06/20-03/05/20, 21 visits (84 units), 03/07/20-03/30/20, 12 visits    Authorization - Visit Number 3    Authorization - Number of Visits 12    PT Start Time 1200   15 minutes late   PT Stop Time 1228    PT Time Calculation (min) 28 min           Past Medical History:  Diagnosis Date  . Anxiety     History reviewed. No pertinent surgical history.  There were no vitals filed for this visit.   Subjective Assessment - 03/21/20 1203    Subjective The knees are 8/10. Not much sleep, legs are hurting from waist down.    Currently in Pain? Yes    Pain Score 8     Pain Location Leg    Pain Orientation Left;Right;Lower;Upper;Mid    Pain Descriptors / Indicators Aching    Pain Type Chronic pain    Aggravating Factors  nothing specific, just some days are worse than others    Pain Relieving Factors tape helps the knees                             [facility] Adult PT Treatment/Exercise - 03/21/20 0001      Knee/Hip Exercises: Stretches   Other Knee/Hip Stretches slant board  60 sec       Knee/Hip Exercises: Aerobic   Recumbent Bike L2 x 6 minutes       Knee/Hip Exercises: Standing   SLS 10-15 sec from foam oval       Knee/Hip Exercises: Seated   Sit to Sand 20 reps      Knee/Hip Exercises: Sidelying   Hip ABduction Right;Left;20 reps      Knee/Hip Exercises: Prone   Hip  Extension 10 reps   2 sets   Hip Extension Limitations RT/LT    Other Prone Exercises donkey kicks x 10 each x 2       Manual Therapy   McConnell medial patellar pull bil   seated with pt instruction,                    PT Short Term Goals - 03/05/20 1248      PT SHORT TERM GOAL #1   Title Pt will demo gross strength 4+/5    Baseline see flowsheet, initial measures improved , hip flexion and abduction 4+/5    Time 3    Period Weeks    Status Achieved    Target Date 02/17/20      PT SHORT TERM GOAL #2   Title pt will report compliance with HEP as it has been established in the short term    Baseline walking every morning, reports doing exercises    Time 3    Period Weeks    Status Achieved    Target Date 02/17/20  PT SHORT TERM GOAL #3   Title Pt will understand use of socks and shoes for support while she is doing demanding activities in standing    Baseline pt reports she is wearing shoes more often now, has not noticed change in her pain as a result.    Time 3    Period Weeks    Status Partially Met    Target Date 02/17/20             PT Long Term Goals - 03/05/20 1250      PT LONG TERM GOAL #1   Title Bilateral knee pain <= 5/10    Baseline 5/10 today,  and last treatment, still has episodes of increased pain up to 10/10 at various times of day    Time 3    Period Weeks    Status On-going      PT LONG TERM GOAL #2   Title pt will be able to navigate stairs with reciprocal pattern    Baseline able to complete in clinic , unable when she has high episodes of pain per her report    Time 3    Period Weeks    Status Achieved      PT LONG TERM GOAL #3   Title LE MMT all at least 4+/5    Baseline see flowsheet    Time 3    Period Weeks    Status On-going                 Plan - 03/21/20 1212    Clinical Impression Statement Pt arrives late due to traffic. The tape continues to help for several days. She still has not purchased any for  self application. Continued with strenthening and increased balance challenge. Re-appled tape. Short session due to tardiness.    PT Next Visit Plan gross strengthening, increase balance challenges, mcconnel tape PRN    PT Home Exercise Plan [id number], added [id number] SLR and side hip abduction           Patient will benefit from skilled therapeutic intervention in order to improve the following deficits and impairments:  Decreased range of motion, Difficulty walking, Increased muscle spasms, Decreased endurance, Decreased activity tolerance, Pain, Improper body mechanics, Impaired flexibility, Decreased strength, Increased edema  Visit Diagnosis: Chronic pain of left knee  Chronic pain of right knee  Localized edema  Muscle weakness (generalized)     Problem List Patient Active Problem List   Diagnosis Date Noted  . Heat intolerance 02/29/2020  . Healthcare maintenance 02/29/2020  . Bilateral knee pain 01/04/2020  . Chronic tension headaches 08/22/2019  . Bilateral headache 03/18/2019  . Vitamin D deficiency 03/18/2019  . COVID-19 virus detected 03/18/2019    Donoho, Jessica McGee, PTA 03/21/2020, 12:27 PM  [facility] [address] [place], [place], [address] Phone: [phone]   Fax:  [phone]  Name: Joanna Ashley MRN: [id number] Date of Birth: 11/11/1985   

## 2020-03-22 ENCOUNTER — Encounter: Payer: Self-pay | Admitting: Physical Therapy

## 2020-03-22 ENCOUNTER — Ambulatory Visit: Payer: Medicaid Other | Admitting: Physical Therapy

## 2020-03-22 DIAGNOSIS — M6281 Muscle weakness (generalized): Secondary | ICD-10-CM

## 2020-03-22 DIAGNOSIS — G8929 Other chronic pain: Secondary | ICD-10-CM | POA: Diagnosis not present

## 2020-03-22 DIAGNOSIS — M25562 Pain in left knee: Secondary | ICD-10-CM | POA: Diagnosis not present

## 2020-03-22 DIAGNOSIS — R600 Localized edema: Secondary | ICD-10-CM | POA: Diagnosis not present

## 2020-03-22 DIAGNOSIS — M25561 Pain in right knee: Secondary | ICD-10-CM | POA: Diagnosis not present

## 2020-03-22 NOTE — Therapy (Signed)
[place], [place], [address] Phone: [phone]   Fax:  [phone]  Physical Therapy Treatment  Patient Details  Name: Joanna Ashley MRN: [id number] Date of Birth: 26-Oct-1985 Referring Provider (PT): Lind Covert, MD   Encounter Date: 03/22/2020   PT End of Session - 03/22/20 1024    Visit Number 12    Number of Visits 21    Date for PT Re-Evaluation 04/29/20    Authorization Type [employer]- auth submitted 7/14, re-auth submitted 8/23 to extend authorization period    Authorization Time Period 02/06/20-03/05/20, 21 visits (84 units), 03/07/20-03/30/20, 12 visits    Authorization - Visit Number 4    Authorization - Number of Visits 12    PT Start Time 8891    PT Stop Time 1100    PT Time Calculation (min) 45 min           Past Medical History:  Diagnosis Date  . Anxiety     History reviewed. No pertinent surgical history.  There were no vitals filed for this visit.   Subjective Assessment - 03/22/20 1020    Subjective I get pain lower lumbar area. Pain more in back  than in knees now. I slept well last night, better than the night before.    Patient is accompained by: Interpreter   Meriam, video interpreter   Currently in Pain? Yes    Pain Score 4     Pain Location Back    Pain Orientation Right;Left;Lower    Pain Descriptors / Indicators Sharp;Squeezing    Pain Type Chronic pain                             [facility] Adult PT Treatment/Exercise - 03/22/20 0001      Knee/Hip Exercises: Stretches   Other Knee/Hip Stretches slant board  60 sec       Knee/Hip Exercises: Aerobic   Recumbent Bike L3 x 6 minutes       Knee/Hip Exercises: Standing   Heel Raises Both;20 reps    Forward Step Up 10 reps    Forward Step Up Limitations onto BOSU Rt/LT    Functional Squat 20 reps    Functional Squat Limitations hands at free motion     SLS 10-15 sec from foam oval      SLS with Vectors from foam- intermittent touch       Knee/Hip Exercises: Supine   Straight Leg Raises 10 reps    Straight Leg Raises Limitations with opp reach to knee    Other Supine Knee/Hip Exercises Pelvic tilit x 20 -min cues    Other Supine Knee/Hip Exercises Bridge x 20                   PT Education - 03/22/20 1103    Education Details HEP    Person(s) Educated Patient    Methods Explanation;Handout    Comprehension Verbalized understanding            PT Short Term Goals - 03/05/20 1248      PT SHORT TERM GOAL #1   Title Pt will demo gross strength 4+/5    Baseline see flowsheet, initial measures improved , hip flexion and abduction 4+/5    Time 3    Period Weeks    Status Achieved    Target Date 02/17/20      PT SHORT TERM GOAL #2  Title pt will report compliance with HEP as it has been established in the short term    Baseline walking every morning, reports doing exercises    Time 3    Period Weeks    Status Achieved    Target Date 02/17/20      PT SHORT TERM GOAL #3   Title Pt will understand use of socks and shoes for support while she is doing demanding activities in standing    Baseline pt reports she is wearing shoes more often now, has not noticed change in her pain as a result.    Time 3    Period Weeks    Status Partially Met    Target Date 02/17/20             PT Long Term Goals - 03/05/20 1250      PT LONG TERM GOAL #1   Title Bilateral knee pain <= 5/10    Baseline 5/10 today,  and last treatment, still has episodes of increased pain up to 10/10 at various times of day    Time 3    Period Weeks    Status On-going      PT LONG TERM GOAL #2   Title pt will be able to navigate stairs with reciprocal pattern    Baseline able to complete in clinic , unable when she has high episodes of pain per her report    Time 3    Period Weeks    Status Achieved      PT LONG TERM GOAL #3   Title LE MMT all at least 4+/5    Baseline see  flowsheet    Time 3    Period Weeks    Status On-going                 Plan - 03/22/20 1025    Clinical Impression Statement Pt reports no leg pain today and reports pain is more in her lower back today. She slept better last night. Increased balance challenges and added core stabilization to promote proximal stability.    PT Next Visit Plan gross strengthening, increase balance challenges, core , mcconnel tape PRN    PT Home Exercise Plan [id number], added [id number] SLR and side hip abduction, added pelvic tilit and SLR with reach    Consulted and Agree with Plan of Care Patient           Patient will benefit from skilled therapeutic intervention in order to improve the following deficits and impairments:  Decreased range of motion, Difficulty walking, Increased muscle spasms, Decreased endurance, Decreased activity tolerance, Pain, Improper body mechanics, Impaired flexibility, Decreased strength, Increased edema  Visit Diagnosis: Chronic pain of left knee  Chronic pain of right knee  Localized edema  Muscle weakness (generalized)     Problem List Patient Active Problem List   Diagnosis Date Noted  . Heat intolerance 02/29/2020  . Healthcare maintenance 02/29/2020  . Bilateral knee pain 01/04/2020  . Chronic tension headaches 08/22/2019  . Bilateral headache 03/18/2019  . Vitamin D deficiency 03/18/2019  . COVID-19 virus detected 03/18/2019    Dorene Ar, PTA 03/22/2020, 11:06 AM  [facility] [address] [place], [place], [address] Phone: [phone]   Fax:  [phone]  Name: Shalayna Ornstein MRN: [id number] Date of Birth: [birth year]

## 2020-03-22 NOTE — Patient Instructions (Signed)
Access Code: [email or web address] URL: [email or web address] Date: 03/22/2020 Prepared by: Jannette Spanner  Pelvic tilt - 1 x daily - 7 x weekly - 3 sets - 10 reps Straight Leg Raise with opp arm reach  - 1 x daily - 7 x weekly - 2 sets - 10 reps

## 2020-03-23 ENCOUNTER — Ambulatory Visit: Payer: Medicaid Other | Admitting: Physical Therapy

## 2020-03-28 ENCOUNTER — Ambulatory Visit: Payer: Medicaid Other | Admitting: Physical Therapy

## 2020-03-28 ENCOUNTER — Encounter: Payer: Self-pay | Admitting: Physical Therapy

## 2020-03-28 ENCOUNTER — Other Ambulatory Visit: Payer: Self-pay

## 2020-03-28 DIAGNOSIS — G8929 Other chronic pain: Secondary | ICD-10-CM

## 2020-03-28 DIAGNOSIS — M6281 Muscle weakness (generalized): Secondary | ICD-10-CM

## 2020-03-28 DIAGNOSIS — R600 Localized edema: Secondary | ICD-10-CM

## 2020-03-28 DIAGNOSIS — M25561 Pain in right knee: Secondary | ICD-10-CM | POA: Diagnosis not present

## 2020-03-28 DIAGNOSIS — M25562 Pain in left knee: Secondary | ICD-10-CM | POA: Diagnosis not present

## 2020-03-28 NOTE — Therapy (Signed)
[place], [place], [address] Phone: [phone]   Fax:  [phone]  Physical Therapy Treatment  Patient Details  Name: Joanna Ashley MRN: [id number] Date of Birth: 09-30-1985 Referring Provider (PT): Lind Covert, MD   Encounter Date: 03/28/2020   PT End of Session - 03/28/20 1156    Visit Number 13    Number of Visits 21    Date for PT Re-Evaluation 04/29/20    Authorization Type [employer]- auth submitted 7/14, re-auth submitted 8/23 to extend authorization period    Authorization Time Period 02/06/20-03/05/20, 21 visits (84 units), 03/07/20-03/30/20, 12 visits    Authorization - Visit Number 5    Authorization - Number of Visits 12    PT Start Time 0600    PT Stop Time 1230    PT Time Calculation (min) 41 min           Past Medical History:  Diagnosis Date  . Anxiety     History reviewed. No pertinent surgical history.  There were no vitals filed for this visit.   Subjective Assessment - 03/28/20 1155    Subjective 5/10 back pain, 7/10 leg/knees feel warm and fatigued in legs today. Back pain is a little better.    Currently in Pain? Yes    Pain Score 5     Pain Location Leg    Pain Orientation Left;Right    Pain Descriptors / Indicators Heaviness    Pain Type Chronic pain    Aggravating Factors  nothing specific, some days are worse    Pain Relieving Factors tape, rest                             [facility] Adult PT Treatment/Exercise - 03/28/20 0001      Knee/Hip Exercises: Aerobic   Recumbent Bike L3 x 6 minutes       Knee/Hip Exercises: Standing   Heel Raises Both;20 reps    Forward Step Up 20 reps;Hand Hold: 1;Step Height: 8"    Functional Squat 20 reps    Functional Squat Limitations hands at free motion     SLS 10-15 sec from foam oval       Knee/Hip Exercises: Supine   Straight Leg Raises 10 reps    Straight Leg Raises Limitations with opp reach  to knee    Other Supine Knee/Hip Exercises Pelvic tilit x 20 -min cues    Other Supine Knee/Hip Exercises Bridge x 20       Knee/Hip Exercises: Sidelying   Hip ABduction Right;Left;20 reps      Knee/Hip Exercises: Prone   Hip Extension 10 reps   2 sets   Hip Extension Limitations RT/LT                    PT Short Term Goals - 03/05/20 1248      PT SHORT TERM GOAL #1   Title Pt will demo gross strength 4+/5    Baseline see flowsheet, initial measures improved , hip flexion and abduction 4+/5    Time 3    Period Weeks    Status Achieved    Target Date 02/17/20      PT SHORT TERM GOAL #2   Title pt will report compliance with HEP as it has been established in the short term    Baseline walking every morning, reports doing exercises    Time 3  Period Weeks    Status Achieved    Target Date 02/17/20      PT SHORT TERM GOAL #3   Title Pt will understand use of socks and shoes for support while she is doing demanding activities in standing    Baseline pt reports she is wearing shoes more often now, has not noticed change in her pain as a result.    Time 3    Period Weeks    Status Partially Met    Target Date 02/17/20             PT Long Term Goals - 03/05/20 1250      PT LONG TERM GOAL #1   Title Bilateral knee pain <= 5/10    Baseline 5/10 today,  and last treatment, still has episodes of increased pain up to 10/10 at various times of day    Time 3    Period Weeks    Status On-going      PT LONG TERM GOAL #2   Title pt will be able to navigate stairs with reciprocal pattern    Baseline able to complete in clinic , unable when she has high episodes of pain per her report    Time 3    Period Weeks    Status Achieved      PT LONG TERM GOAL #3   Title LE MMT all at least 4+/5    Baseline see flowsheet    Time 3    Period Weeks    Status On-going                 Plan - 03/28/20 1328    Clinical Impression Statement Pt arrives reporting  increased lower leg fatigue and warmth. Her back is a little better. Continued with functional strengthening and she completes all therex with min c/o LBP pain after session.    PT Next Visit Plan gross strengthening, increase balance challenges, core , mcconnel tape PRN    PT Home Exercise Plan [id number], added [id number] SLR and side hip abduction, added pelvic tilit and SLR with reach           Patient will benefit from skilled therapeutic intervention in order to improve the following deficits and impairments:  Decreased range of motion, Difficulty walking, Increased muscle spasms, Decreased endurance, Decreased activity tolerance, Pain, Improper body mechanics, Impaired flexibility, Decreased strength, Increased edema  Visit Diagnosis: Chronic pain of left knee  Chronic pain of right knee  Localized edema  Muscle weakness (generalized)     Problem List Patient Active Problem List   Diagnosis Date Noted  . Heat intolerance 02/29/2020  . Healthcare maintenance 02/29/2020  . Bilateral knee pain 01/04/2020  . Chronic tension headaches 08/22/2019  . Bilateral headache 03/18/2019  . Vitamin D deficiency 03/18/2019  . COVID-19 virus detected 03/18/2019    Dorene Ar, PTA 03/28/2020, 1:33 PM  [place], [place], [address] Phone: [phone]   Fax:  [phone]  Name: Joanna Ashley MRN: [id number] Date of Birth: 07/17/1985

## 2020-03-30 ENCOUNTER — Other Ambulatory Visit: Payer: Self-pay

## 2020-03-30 ENCOUNTER — Ambulatory Visit: Payer: Medicaid Other | Admitting: Physical Therapy

## 2020-03-30 ENCOUNTER — Encounter: Payer: Self-pay | Admitting: Physical Therapy

## 2020-03-30 DIAGNOSIS — M25561 Pain in right knee: Secondary | ICD-10-CM

## 2020-03-30 DIAGNOSIS — R600 Localized edema: Secondary | ICD-10-CM

## 2020-03-30 DIAGNOSIS — M25562 Pain in left knee: Secondary | ICD-10-CM

## 2020-03-30 DIAGNOSIS — M6281 Muscle weakness (generalized): Secondary | ICD-10-CM

## 2020-03-30 DIAGNOSIS — G8929 Other chronic pain: Secondary | ICD-10-CM | POA: Diagnosis not present

## 2020-03-30 NOTE — Therapy (Addendum)
[place], [place], [address] Phone: [phone]   Fax:  [phone]  Physical Therapy Treatment/Discharge  Patient Details  Name: Joanna Ashley MRN: [id number] Date of Birth: 1985-08-20 Referring Provider (PT): Lind Covert, MD   Encounter Date: 03/30/2020   PT End of Session - 03/30/20 1116    Visit Number 14    Number of Visits 21    Date for PT Re-Evaluation 04/29/20    Authorization Type [employer]- auth submitted 7/14, re-auth submitted 8/23 to extend authorization period    Authorization Time Period 02/06/20-03/05/20, 21 visits (84 units), 03/07/20-03/30/20, 12 visits    Authorization - Visit Number 6    Authorization - Number of Visits 12    PT Start Time 1107    PT Stop Time 1149    PT Time Calculation (min) 42 min           Past Medical History:  Diagnosis Date   Anxiety     History reviewed. No pertinent surgical history.  There were no vitals filed for this visit.   Subjective Assessment - 03/30/20 1110    Subjective I am having back pain. Still having sharp pain in legs, limtited standing in kitchen.    Currently in Pain? Yes    Pain Score 8     Pain Location Leg   and lumbar   Pain Orientation Left;Right    Pain Descriptors / Indicators Sharp    Pain Radiating Towards ankles    Aggravating Factors  nothing specific, some days worse than others    Pain Relieving Factors tape to knees, rest              [facility] PT Assessment - 03/30/20 0001      Strength   Right Hip Flexion 4+/5    Right Hip Extension 4-/5    Right Hip ABduction 4+/5    Left Hip Flexion 4+/5    Left Hip Extension 4-/5    Left Hip ABduction 4+/5                         [facility] Adult PT Treatment/Exercise - 03/30/20 0001      Self-Care   Self-Care Other Self-Care Comments    Other Self-Care Comments  Education provided on the need to report to MD her  multi joint pain and  intermittent LE edema and warmth as well as intermitent LE faigue., education also provided on chronic pain and the benfits of continued long term exercise.       Knee/Hip Exercises: Stretches   Other Knee/Hip Stretches slant board  60 sec       Knee/Hip Exercises: Aerobic   Recumbent Bike L3 x 5 minutes       Knee/Hip Exercises: Standing   Heel Raises Both;20 reps    Functional Squat 20 reps    Functional Squat Limitations hands at free motion     SLS 10-15 sec from foam oval       Knee/Hip Exercises: Supine   Straight Leg Raises 10 reps    Straight Leg Raises Limitations with opp reach to knee    Other Supine Knee/Hip Exercises Pelvic tilit x 20 -min cues    Other Supine Knee/Hip Exercises Bridge x 20       Knee/Hip Exercises: Sidelying   Hip ABduction Right;Left;20 reps      Knee/Hip Exercises: Prone   Hip Extension 10 reps   2  sets   Hip Extension Limitations RT/LT                    PT Short Term Goals - 03/05/20 1248      PT SHORT TERM GOAL #1   Title Pt will demo gross strength 4+/5    Baseline see flowsheet, initial measures improved , hip flexion and abduction 4+/5    Time 3    Period Weeks    Status Achieved    Target Date 02/17/20      PT SHORT TERM GOAL #2   Title pt will report compliance with HEP as it has been established in the short term    Baseline walking every morning, reports doing exercises    Time 3    Period Weeks    Status Achieved    Target Date 02/17/20      PT SHORT TERM GOAL #3   Title Pt will understand use of socks and shoes for support while she is doing demanding activities in standing    Baseline pt reports she is wearing shoes more often now, has not noticed change in her pain as a result.    Time 3    Period Weeks    Status Partially Met    Target Date 02/17/20             PT Long Term Goals - 03/30/20 1137      PT LONG TERM GOAL #1   Title Bilateral knee pain <= 5/10    Baseline has rated as low as 5/10,  8/10 today with intermittent episodes of 10/10 at various times of the day    Time 3    Period Weeks    Status Not Met      PT LONG TERM GOAL #2   Title pt will be able to navigate stairs with reciprocal pattern    Baseline able to complete in clinic , unable when she has high episodes of pain per her report    Time 3    Period Weeks    Status Achieved      PT LONG TERM GOAL #3   Title LE MMT all at least 4+/5    Baseline see flowsheet    Time 3    Period Weeks    Status Partially Met                 Plan - 03/30/20 1138    Clinical Impression Statement Today Mrs Devita reports her back pain is worse and knees/legs are 8/10 with sharp pains this morning. Her ankles are slightly swollen which comes and goes. Her LE strength has improved and she functions normally when she is not in high level of pain. She reports frequent increases in low back and bilateral ankle and knee pain. She reports some days LEs are fatigues and knees give way. Time spent with education on chronic pain and the need to report her multiple joint pain , LE fatigue and intermittent edema to MD for further evaluation. She verbalized understanding via interpreter. She has partially met her LTGS. She has comprehensive HEP for continued strengthening and pain management. She is agreeable to discharge to HEP and return to MD for further evaluation.    PT Next Visit Plan DC today    PT Home Exercise Plan [id number], added [id number] SLR and side hip abduction, added pelvic tilit and SLR with reach           Patient will  benefit from skilled therapeutic intervention in order to improve the following deficits and impairments:  Decreased range of motion, Difficulty walking, Increased muscle spasms, Decreased endurance, Decreased activity tolerance, Pain, Improper body mechanics, Impaired flexibility, Decreased strength, Increased edema  Visit Diagnosis: Chronic pain of left knee  Chronic pain of right knee  Localized  edema  Muscle weakness (generalized)     Problem List Patient Active Problem List   Diagnosis Date Noted   Heat intolerance 02/29/2020   Healthcare maintenance 02/29/2020   Bilateral knee pain 01/04/2020   Chronic tension headaches 08/22/2019   Bilateral headache 03/18/2019   Vitamin D deficiency 03/18/2019   COVID-19 virus detected 03/18/2019    Dorene Ar, PTA 03/30/2020, 11:51 AM  [facility] [address] [place], [place], [address] Phone: [phone]   Fax:  [phone]  Name: Joanna Ashley MRN: [id number] Date of Birth: 1985/10/25  PHYSICAL THERAPY DISCHARGE SUMMARY  Visits from Start of Care: 14  Current functional level related to goals / functional outcomes: See above   Remaining deficits: See above   Education / Equipment: Anatomy of condition, POC, HEP, exercise form/rationale  Plan: Patient agrees to discharge.  Patient goals were partially met. Patient is being discharged due to lack of progress.  ?????     Nikolis Berent C. Hightower PT, DPT 03/30/20 2:46 PM

## 2020-04-11 ENCOUNTER — Ambulatory Visit: Payer: Medicaid Other | Admitting: Physical Therapy

## 2020-04-13 ENCOUNTER — Encounter: Payer: Self-pay | Admitting: Physical Therapy

## 2020-04-26 ENCOUNTER — Other Ambulatory Visit: Payer: Self-pay

## 2020-04-26 ENCOUNTER — Ambulatory Visit: Payer: Medicaid Other | Admitting: Family Medicine

## 2020-04-26 ENCOUNTER — Encounter: Payer: Self-pay | Admitting: Family Medicine

## 2020-04-26 ENCOUNTER — Other Ambulatory Visit (HOSPITAL_COMMUNITY)
Admission: RE | Admit: 2020-04-26 | Discharge: 2020-04-26 | Disposition: A | Discharge status: Home / Self Care | Payer: Medicaid Other | Source: Ambulatory Visit | Attending: Family Medicine | Admitting: Family Medicine

## 2020-04-26 VITALS — BP 110/80 | HR 60 | Ht 62.99 in | Wt 189.0 lb

## 2020-04-26 DIAGNOSIS — N926 Irregular menstruation, unspecified: Secondary | ICD-10-CM | POA: Diagnosis not present

## 2020-04-26 DIAGNOSIS — Z124 Encounter for screening for malignant neoplasm of cervix: Secondary | ICD-10-CM | POA: Diagnosis not present

## 2020-04-26 DIAGNOSIS — N946 Dysmenorrhea, unspecified: Secondary | ICD-10-CM | POA: Diagnosis not present

## 2020-04-26 DIAGNOSIS — L210 Seborrhea capitis: Secondary | ICD-10-CM

## 2020-04-26 DIAGNOSIS — R102 Pelvic and perineal pain: Secondary | ICD-10-CM | POA: Diagnosis not present

## 2020-04-26 DIAGNOSIS — G8929 Other chronic pain: Secondary | ICD-10-CM | POA: Diagnosis not present

## 2020-04-26 DIAGNOSIS — M25562 Pain in left knee: Secondary | ICD-10-CM | POA: Diagnosis not present

## 2020-04-26 DIAGNOSIS — Z789 Other specified health status: Secondary | ICD-10-CM | POA: Insufficient documentation

## 2020-04-26 DIAGNOSIS — M25561 Pain in right knee: Secondary | ICD-10-CM | POA: Diagnosis not present

## 2020-04-26 LAB — POCT WET PREP (WET MOUNT)
Clue Cells Wet Prep Whiff POC: NEGATIVE
Trichomonas Wet Prep HPF POC: ABSENT

## 2020-04-26 MED ORDER — SELENIUM SULFIDE 2.5 % EX LOTN: 1.0000 "application " | TOPICAL_LOTION | CUTANEOUS | 0 refills | Status: DC

## 2020-04-26 MED ORDER — IBUPROFEN 600 MG PO TABS: 600.0000 mg | ORAL_TABLET | Freq: Four times a day (QID) | ORAL | 1 refills | Status: DC

## 2020-04-26 NOTE — Assessment & Plan Note (Signed)
Chronic.  Reassuringly no evidence of pathologic scalp changes on exam.  Recommended trialing Selsun Blue shampoo once to twice weekly.

## 2020-04-26 NOTE — Assessment & Plan Note (Signed)
Improved with PT, however requesting evaluation by specialist given continued concern and chronic nature.  Will review physical therapy documentation and refer to orthopedic per request.

## 2020-04-26 NOTE — Assessment & Plan Note (Signed)
Recommended starting to take a prenatal vitamin.  Provided reassurance.

## 2020-04-26 NOTE — Assessment & Plan Note (Addendum)
Prior to cycles only, do question secondary etiology such as adenomyosis vs fibroids given she had not had significant pelvic cramping prior to cycles until recently.  However, not associated with AUB at this time.  Pap performed.  Obtained wet prep, GC/CH rule out any contributing vaginal infection however low suspicion for this.  Rx'd ibuprofen q 6 scheduled at onset of cramping and recommended heating pads.  Follow-up in 2 months or sooner if needed.  OB/GYN referral pending, patient would still like to see them for further evaluation and recommendations.

## 2020-04-26 NOTE — Patient Instructions (Addendum)
It was wonderful to see you.  For your menstrual cycle: To help with the cramping, I have sent in ibuprofen.  As soon as you feel cramping and back pain start to occur, I want you to take this medication and then use that about every 6 hours while awake to help with discomfort throughout the first/second day of her menstrual cycle when it starts to wear out.  You can also use a heating pad on your pelvic area for about 20 minutes at a time to help with discomfort.  I encourage you to stay active as this will decrease cramping.  You should start taking a prenatal vitamin.  Encourage you to continue trying to conceive and continue monitoring your cycles, you would expect ovulation to be about around 1 week after your cycle.  For your scalp: You can try using the Selsun Blue shampoo as below about once to twice a week in place of your regular shampoo this will help with the itching and

## 2020-04-26 NOTE — Progress Notes (Signed)
    SUBJECTIVE:   CHIEF COMPLAINT / HPI: Check in  Joanna Ashley is 34 yo female presenting to discuss the following:   I-pad tigrainan interpreter used for duration of visit.   Scalp concern: Dry scalp, gets "starch" when she scratches. Itchy. All over her head, not localized. Feels like some of hair comes out when she itches, but few strands and no patches. Going on for over 3 years, feels like its getting worse. Washes her hair 1-2 times/week with shampoo and conditioner.    Menstrual abnormality: LMP October 3rd, gets cramping and bloating 2 days prior to cycle. She is concerned because started having a lot of cramping pains that go to her back for the past 3 months prior to cycles and never seemed to have this before with her periods. Doesn't want to eat when it happens. Cycles prior to this on Sept 18, then Aug 24. Sometimes she gets a cycle every 3-4 weeks and wants to make sure this is okay. Now light, previously heavy when she was younger. She would like to have more children, last child was 12 years ago ([id number]), vaginal. Trying to conceive for the past 8 months not during ovaluation, not on PNV yet.  No previous gynecological surgeries.  Denies any changes in vaginal discharge, irritation/itching.  Monogamous with her husband.  Urine pregnancy test negative on Monday.  PERTINENT  PMH / PSH: Chronic tension headaches, vitamin D deficiency, Covid in 11/2018  OBJECTIVE:   BP 110/80   Pulse 60   Ht 5' 2.99" (1.6 m)   Wt 189 lb (85.7 kg)   SpO2 99%   BMI 33.49 kg/m   General: Alert, NAD HEENT: NCAT, MMM, minimal dandruff noted within scalp, no rash or regions of hair loss noted throughout entirety of scalp Lungs: No increased WOB  Abdomen: soft, non-tender Msk: Moves all extremities spontaneously  Ext: Warm, dry Pelvic exam: VULVA: normal appearing vulva with no masses, tenderness or lesions, VAGINA: normal appearing vagina with normal color and discharge, no lesions, CERVIX:  Transformation zone seen, cervical discharge present - clear and copious, cervical motion tenderness absent, multiparous os, UTERUS: uterus is normal size, shape, consistency and nontender, ADNEXA: normal adnexa in size, nontender and no masses.  Chaperoned by CMA  ASSESSMENT/PLAN:   Dysmenorrhea Prior to cycles only, do question secondary etiology such as adenomyosis vs fibroids given she had not had significant pelvic cramping prior to cycles until recently.  However, not associated with AUB at this time.  Pap performed.  Obtained wet prep, GC/CH rule out any contributing vaginal infection however low suspicion for this.  Rx'd ibuprofen q 6 scheduled at onset of cramping and recommended heating pads.  Follow-up in 2 months or sooner if needed.  OB/GYN referral pending, patient would still like to see them for further evaluation and recommendations.  Dandruff in adult Chronic.  Reassuringly no evidence of pathologic scalp changes on exam.  Recommended trialing Selsun Blue shampoo once to twice weekly.  Bilateral knee pain Improved with PT, however requesting evaluation by specialist given continued concern and chronic nature.  Will review physical therapy documentation and refer to orthopedic per request.  Attempting to conceive Recommended starting to take a prenatal vitamin.  Provided reassurance.     Follow-up in 2 months for above or sooner if needed.  [name], DO [facility]

## 2020-04-30 LAB — CYTOLOGY - PAP
Chlamydia: NEGATIVE
Comment: NEGATIVE
Comment: NEGATIVE
Comment: NEGATIVE
Comment: NORMAL
Diagnosis: NEGATIVE
High risk HPV: NEGATIVE
Neisseria Gonorrhea: NEGATIVE
Trichomonas: NEGATIVE

## 2020-05-02 ENCOUNTER — Encounter: Payer: Self-pay | Admitting: Family Medicine

## 2020-05-07 ENCOUNTER — Telehealth: Payer: Self-pay | Admitting: Family Medicine

## 2020-05-07 NOTE — Telephone Encounter (Signed)
Pt. walked in stating the doctor prescribed two medications, one for pain and the other was prenatal.  The pharmacy had no order for prenatal and gave her two different pain medications. Pt also requesting results of lab test.  Pls call her at [phone]

## 2020-05-09 ENCOUNTER — Other Ambulatory Visit: Payer: Self-pay | Admitting: Family Medicine

## 2020-05-09 DIAGNOSIS — Z789 Other specified health status: Secondary | ICD-10-CM

## 2020-05-09 DIAGNOSIS — N926 Irregular menstruation, unspecified: Secondary | ICD-10-CM

## 2020-05-09 MED ORDER — PRENATAL 27-1 MG PO TABS: 1.0000 | ORAL_TABLET | Freq: Every day | ORAL | 1 refills | Status: DC

## 2020-05-09 NOTE — Telephone Encounter (Signed)
Attempted to reach patient using Tigrinian interpreter.  No answer and left voicemail.  If she calls the clinic please let her know that all of her results were normal and I had sent a letter last week with this information for her to have on hand.  Additionally I had only sent in ibuprofen during her visit and discussed getting her preferred prenatal OTC, however for ease sent in a prenatal vitamin to her pharmacy this morning.  I have reviewed her physical therapy and previous orthopedic notes for her knee/ankle pains and would like her to schedule a follow-up visit in the next few weeks to discuss this further prior to sending an additional referral to specialist.  [name], DO

## 2020-05-21 ENCOUNTER — Encounter: Payer: Self-pay | Admitting: Student in an Organized Health Care Education/Training Program

## 2020-05-21 ENCOUNTER — Other Ambulatory Visit: Payer: Self-pay

## 2020-05-21 ENCOUNTER — Ambulatory Visit (INDEPENDENT_AMBULATORY_CARE_PROVIDER_SITE_OTHER): Payer: Medicaid Other | Admitting: Student in an Organized Health Care Education/Training Program

## 2020-05-21 DIAGNOSIS — Z712 Person consulting for explanation of examination or test findings: Secondary | ICD-10-CM | POA: Diagnosis not present

## 2020-05-21 DIAGNOSIS — Z23 Encounter for immunization: Secondary | ICD-10-CM | POA: Diagnosis not present

## 2020-05-21 NOTE — Patient Instructions (Signed)
It was a pleasure to see you today!  To summarize our discussion for this visit:  You can get the COVID-19 vaccine. I cannot write a medical exception letter for you having an allergy to the vaccines.   The phone number for your OB/GYN referral is:  [phone]  [address], [place], [place] [address]  [facility]     Some additional health maintenance measures we should update are: Health Maintenance Due  Topic Date Due  . Hepatitis C Screening  Never done  . COVID-19 Vaccine (1) Never done  . TETANUS/TDAP  Never done  .    Call the clinic at [phone] if your symptoms worsen or you have any concerns.   Thank you for allowing me to take part in your care,  Dr. Jamelle Rushing

## 2020-05-21 NOTE — Progress Notes (Signed)
   SUBJECTIVE:   CHIEF COMPLAINT / HPI: f/u pre-conception counseling and COVID-19 vaccine.   Lab results- Results mailed to her from previous appointment are in english and she cannot read them. She is coming here to have them explained to her. Has not set up an appointment with OB/GYN yet. She has been taking the prenatal vitamin and needs a refill. There is a refill at her pharmacy already. Patient was informed of this.  covid 19 vaccine- Corona vaccine required for work. Patient is requesting a letter for exemption from the vaccine because she has a headache and allergic reaction to the vaccine. Has never had the covid vaccine. She had covid-19 infection and had "fluid in her lungs" and got injections in the hospital which gave her a headache and this is why she thinks she is not able to get the vaccine. Discussed with patient about the difference between the vaccine and the infection and transfusion treatments. That her experience was not an allergic reaction and that she can get the vaccine still if she desires. That we will observe her for 15 minutes after the injection to see if she has any adverse reaction and she can reach out to [place] if anything abnormal occurs outside of the expected side effects of the vaccine that were described to her in detail with the tigrinian interpretor.   OBJECTIVE:   BP 110/62   Pulse 68   Wt 190 lb 9.6 oz (86.5 kg)   SpO2 97%   BMI 33.77 kg/m   General: NAD, pleasant, able to participate in exam Cardiac: RRR, normal heart sounds, no murmurs. 2+ radial and PT pulses bilaterally Respiratory: CTAB, normal effort, No wheezes, rales or rhonchi Skin: warm and dry, no rashes noted Neuro: alert and oriented, no focal deficits Psych: Normal affect and mood  ASSESSMENT/PLAN:   Encounter to discuss test results Reviewed patients lab results from previous visit including pap and STD screening were all normal using tigrinian interpretor.  Patient had no follow up  questions.  We also used the interpretor to call and schedule patient an appointment with the OB/GYN referral.   COVID-19 vaccine administered Discussed in detail the covid vaccine and her previous infection.  She does not have any contraindications to getting the vaccine today and now that she is informed, she is amenable to getting the vaccine.  First dose administered today and scheduled for second dose. Observed >15 min after administration without adverse effects.      Leeroy Bock, DO [facility]

## 2020-05-22 DIAGNOSIS — Z23 Encounter for immunization: Secondary | ICD-10-CM | POA: Insufficient documentation

## 2020-05-22 DIAGNOSIS — Z712 Person consulting for explanation of examination or test findings: Secondary | ICD-10-CM | POA: Insufficient documentation

## 2020-05-22 NOTE — Assessment & Plan Note (Signed)
Discussed in detail the covid vaccine and her previous infection.  She does not have any contraindications to getting the vaccine today and now that she is informed, she is amenable to getting the vaccine.  First dose administered today and scheduled for second dose. Observed >15 min after administration without adverse effects.

## 2020-05-22 NOTE — Assessment & Plan Note (Signed)
Reviewed patients lab results from previous visit including pap and STD screening were all normal using tigrinian interpretor.  Patient had no follow up questions.  We also used the interpretor to call and schedule patient an appointment with the OB/GYN referral.

## 2020-07-02 ENCOUNTER — Ambulatory Visit: Payer: Medicaid Other | Admitting: Obstetrics and Gynecology

## 2020-08-06 ENCOUNTER — Ambulatory Visit: Payer: Medicaid Other | Admitting: Obstetrics and Gynecology

## 2020-09-03 ENCOUNTER — Ambulatory Visit (INDEPENDENT_AMBULATORY_CARE_PROVIDER_SITE_OTHER): Payer: Medicaid Other | Admitting: Obstetrics and Gynecology

## 2020-09-03 ENCOUNTER — Other Ambulatory Visit: Payer: Self-pay

## 2020-09-03 ENCOUNTER — Encounter: Payer: Self-pay | Admitting: Obstetrics and Gynecology

## 2020-09-03 VITALS — BP 112/69 | HR 67 | Ht 63.0 in | Wt 197.2 lb

## 2020-09-03 DIAGNOSIS — N926 Irregular menstruation, unspecified: Secondary | ICD-10-CM

## 2020-09-03 MED ORDER — PREPLUS 27-1 MG PO TABS: 1.0000 | ORAL_TABLET | Freq: Every day | ORAL | 13 refills | Status: DC

## 2020-09-03 NOTE — Progress Notes (Signed)
Patient presents as a New Patient for Irregular menses and trying to conceive. She states that her cycle has started coming on later in the month, and it usually comes on earlier

## 2020-09-03 NOTE — Progress Notes (Signed)
35 yo P2 with LMP 08/31/20 and BMI 34 presenting today for the evaluation of irregular menses. Patient reports a history of a monthly menses occurring every month on the exact same date. Over the past few months however, her menses start 3 days prior or 3 days later. She states that the vaginal bleeding last no more than 7 days. She denies significant dysmenorrhea. She is sexually active with her husband who has been in the country for the past year. She is seeking a pregnancy. Her 2 previous children were conceived naturally. The last one being 35 years old. Patient reports no change in her medical history or her husbands'. Patient is without any other complaints. She had a normal physical in October with normal TSH  Past Medical History:  Diagnosis Date  . Anxiety    No past surgical history on file. No family history on file. Social History   Tobacco Use  . Smoking status: Never Smoker  . Smokeless tobacco: Never Used  Substance Use Topics  . Alcohol use: No  . Drug use: No   ROS See pertinent in HPI. All other systems reviewed and non contributory  Blood pressure 112/69, pulse 67, height 5\' 3"  (1.6 m), weight 197 lb 3.2 oz (89.4 kg), last menstrual period 08/31/2020.  GENERAL: Well-developed, well-nourished female in no acute distress.  NEURO: alert and oriented x 3  A/P 35 yo P2  - Normal pap 04/2020 - Reassurance provided on the change in menstrual cycle lenght - Discussed timing of intercourse around ovulation - Rx prenatal vitamins provided - RTC prn

## 2020-10-31 ENCOUNTER — Ambulatory Visit: Payer: Medicaid Other | Admitting: Family Medicine

## 2020-10-31 NOTE — Progress Notes (Deleted)
    SUBJECTIVE:   CHIEF COMPLAINT / HPI: "talk to doctor about something"   ***  PERTINENT  PMH / PSH: dysmenorrhea, chronic tension headaches, bilateral knee pain, COVID 19 in 2020, vitamin D deficiency   OBJECTIVE:   There were no vitals taken for this visit.  ***  ASSESSMENT/PLAN:   No problem-specific Assessment & Plan notes found for this encounter.     [name], DO [facility]   {    This will disappear when note is signed, click to select method of visit    :1}

## 2020-11-06 ENCOUNTER — Ambulatory Visit: Payer: Medicaid Other | Admitting: Family Medicine

## 2020-11-07 ENCOUNTER — Ambulatory Visit: Payer: Medicaid Other | Admitting: Family Medicine

## 2020-11-07 ENCOUNTER — Other Ambulatory Visit: Payer: Self-pay

## 2020-11-07 VITALS — BP 118/65 | HR 66 | Ht 62.8 in | Wt 194.0 lb

## 2020-11-07 DIAGNOSIS — E559 Vitamin D deficiency, unspecified: Secondary | ICD-10-CM | POA: Diagnosis not present

## 2020-11-07 DIAGNOSIS — G6289 Other specified polyneuropathies: Secondary | ICD-10-CM

## 2020-11-07 DIAGNOSIS — M25562 Pain in left knee: Secondary | ICD-10-CM

## 2020-11-07 DIAGNOSIS — R202 Paresthesia of skin: Secondary | ICD-10-CM

## 2020-11-07 DIAGNOSIS — M25561 Pain in right knee: Secondary | ICD-10-CM

## 2020-11-07 DIAGNOSIS — G8929 Other chronic pain: Secondary | ICD-10-CM | POA: Diagnosis not present

## 2020-11-07 NOTE — Patient Instructions (Addendum)
We will check some labs today--call with results.   Please get xrays--[facility] [place].   Please keep elevating your legs and doing your exercises. You can ice several times a day and use voltaren gel.   Follow up 5/18 at 11:10am

## 2020-11-07 NOTE — Progress Notes (Signed)
SUBJECTIVE:   CHIEF COMPLAINT / HPI: "Talk to doctor"   Joanna Ashley is a 35 year old female presenting to discuss the following:   Tigrinian interpreter was used for the duration of the visit.   Headaches: Resolved, does not take medication anymore either. Sleeping well. Looking to go back to work.   Bilateral knee pain: Last seen for this concern in 02/2020, initially in 12/2019.  States a history of this pain from 2016/2017 after bilateral ankle injury at work and had bilateral sprains with peritoneal tendon tears.  Describes as mostly anterior knee pain with occasional throbbing.  Tried physical therapy in summer of last year, states it helped a lot and almost completely resolved her knee pain with the exception of a burning sensation discussed below.  She has been walking several miles every day.  She has been doing her exercises from PT at home with some help. No pain elsewhere.  Denies falls or giving out sensation (although had told this to PT before).   Possible Peripheral neuropathy: Reports a longstanding burning sensation from her knees down to her entire feet bilaterally.  Feels like her legs are warm when it happens.  Mainly notices this when she is standing for too long or goes on long walks.  Whenever she buys new shoes she has no difficulty with this or her knee pain, however after about 3 days that returns.  PERTINENT  PMH / PSH: History of chronic tension headaches recently resolved, vitamin D deficiency, COVID-19 in 11/2018  OBJECTIVE:   BP 118/65   Pulse 66   Ht 5' 2.8" (1.595 m)   Wt 194 lb (88 kg)   SpO2 98%   BMI 34.59 kg/m   General: Alert, NAD HEENT: NCAT, MMM Lungs: Clear bilaterally, no increased WOB  Abdomen: soft, non-tender, non-distended, normoactive BS Msk:  Bilateral knees - Inspection: no gross deformity. No swelling/effusion, erythema or bruising appreciated.  - Palpation: Mild TTP around medial joint line bilaterally. No tenderness elsewhere.  - ROM:  full active ROM with flexion and extension in knee and hip - Strength: 5/5 strength - Neuro/vasc: NV intact - Special Tests: - LIGAMENTS: negative anterior and posterior drawer, no MCL or LCL laxity  -- MENISCUS: Negative [place]  -- PF JOINT: nml patellar mobility bilaterally. Negative patellar grind. Does have mild supination of her feet while walking.  Slight genu valgus deformity.  Ext: Warm, dry, 2+ distal pulses, no edema b/l, sensation to light touch intact throughout bilateral lower extremity including soles of feet  ASSESSMENT/PLAN:   Bilateral knee pain Significant improvement following PT in 2021, likely mechanical related.  Could also consider early onset OA given injuries in the past.  Has not had previous imaging, will obtain bilateral XR's given duration of pain.  Encouraged icing, elevation, Tylenol/ibuprofen, and Voltaren gel.  Should continue PT exercises.  Peripheral neuropathy Chronic intermittent burning sensation from bilateral feet to knee level. Unclear etiology, may be related to previous injuries.  Previous CBC, BMP, TSH, RPR, HIV, CRP/sed rate, RF in 2020/2021 all normal.  Certainly considered connective tissue disease given bilateral knee pains as above, however less likely given her only two concerns and negative lab work as above. Less likely radicular given distribution. Not on any inciting medications.  Will check vitamin B12 level.  May consider nerve conduction study/neurology in the future. Consider gabapentin for relief on follow up visit.   Vitamin D deficiency Check vitamin D level today.    Scheduled follow-up for 5/18  during office visit, sooner if needed.   [name], DO [facility]

## 2020-11-08 LAB — VITAMIN B12: Vitamin B-12: 430 pg/mL (ref 232–1245)

## 2020-11-08 LAB — VITAMIN D 25 HYDROXY (VIT D DEFICIENCY, FRACTURES): Vit D, 25-Hydroxy: 19.3 ng/mL — ABNORMAL LOW (ref 30.0–100.0)

## 2020-11-09 ENCOUNTER — Encounter: Payer: Self-pay | Admitting: Family Medicine

## 2020-11-09 DIAGNOSIS — G629 Polyneuropathy, unspecified: Secondary | ICD-10-CM | POA: Insufficient documentation

## 2020-11-09 NOTE — Assessment & Plan Note (Signed)
Check vitamin D level today 

## 2020-11-09 NOTE — Assessment & Plan Note (Signed)
Significant improvement following PT in 2021, likely mechanical related.  Could also consider early onset OA given injuries in the past.  Has not had previous imaging, will obtain bilateral XR's given duration of pain.  Encouraged icing, elevation, Tylenol/ibuprofen, and Voltaren gel.  Should continue PT exercises.

## 2020-11-09 NOTE — Assessment & Plan Note (Addendum)
Chronic intermittent burning sensation from bilateral feet to knee level. Unclear etiology, may be related to previous injuries.  Previous CBC, BMP, TSH, RPR, HIV, CRP/sed rate, RF in 2020/2021 all normal.  Certainly considered connective tissue disease given bilateral knee pains as above, however less likely given her only two concerns and negative lab work as above. Less likely radicular given distribution. Not on any inciting medications.  Will check vitamin B12 level.  May consider nerve conduction study/neurology in the future. Consider gabapentin for relief on follow up visit.

## 2020-11-28 ENCOUNTER — Other Ambulatory Visit: Payer: Self-pay

## 2020-11-28 ENCOUNTER — Ambulatory Visit: Payer: Medicaid Other | Admitting: Family Medicine

## 2020-11-28 ENCOUNTER — Encounter: Payer: Self-pay | Admitting: Family Medicine

## 2020-11-28 VITALS — BP 99/75 | HR 76 | Ht 62.6 in | Wt 198.6 lb

## 2020-11-28 DIAGNOSIS — G6289 Other specified polyneuropathies: Secondary | ICD-10-CM

## 2020-11-28 DIAGNOSIS — M25562 Pain in left knee: Secondary | ICD-10-CM

## 2020-11-28 DIAGNOSIS — M25561 Pain in right knee: Secondary | ICD-10-CM | POA: Diagnosis not present

## 2020-11-28 DIAGNOSIS — G8929 Other chronic pain: Secondary | ICD-10-CM | POA: Diagnosis not present

## 2020-11-28 DIAGNOSIS — E559 Vitamin D deficiency, unspecified: Secondary | ICD-10-CM

## 2020-11-28 LAB — POCT GLYCOSYLATED HEMOGLOBIN (HGB A1C): Hemoglobin A1C: 5.2 % (ref 4.0–5.6)

## 2020-11-28 MED ORDER — VITAMIN D (ERGOCALCIFEROL) 1.25 MG (50000 UNIT) PO CAPS: 50000.0000 [IU] | ORAL_CAPSULE | ORAL | 0 refills | Status: DC

## 2020-11-28 NOTE — Patient Instructions (Signed)
Please continue to elevate your legs and use your Vaseline.  I will be placing a referral to physical rehab to further look into the warm/burning sensation in your legs.

## 2020-11-28 NOTE — Progress Notes (Signed)
    SUBJECTIVE:   CHIEF COMPLAINT / HPI: Check in   Joanna Ashley is a 35 year old female presenting for follow-up of her vitamin D deficiency and leg pains.  A Tigrinian interpreter via iPad was used for the duration of visit.  Vitamin D deficiency: Discussed recent lab results, vitamin D level around 19.  She has been taking a "normal amount" of vitamin D at home, unable to say specific amount.  Bilateral knee pain/leg burning: Chronic, largely unchanged since last seen.  Still will have a burning sensation from her knees down bilaterally.  Lab work-up for possible etiology of what sounds consistent with neuropathy is unremarkable.  States she has been using Vaseline to help with the "warm" feeling and that seems to help some.  PERTINENT  PMH / PSH: Symptoms concerning for peripheral neuropathy, chronic headaches, vitamin D deficiency, bilateral knee pain  OBJECTIVE:   BP 99/75   Pulse 76   Ht 5' 2.6" (1.59 m)   Wt 198 lb 9.6 oz (90.1 kg)   LMP 11/01/2020 (Exact Date)   SpO2 99%   BMI 35.63 kg/m   General: Alert, NAD HEENT: NCAT, MMM Lungs: No increased WOB  Abdomen: soft Msk: Moves all extremities spontaneously, normal gait.  Nontender to all 3 joint lines of bilateral knees.  Full flexion/extension of knees bilaterally.  Ext: Warm, dry, 2+ distal pulses, no edema bilaterally   ASSESSMENT/PLAN:   Peripheral neuropathy Chronic history of intermittent burning sensation from her bilateral feet to knee level is overall suspicious for peripheral neuropathy.  However, still quite atypical given improvement with daily lotion use and appropriate shoe support.  Chronic knee pain may be contributing to this sensation as well.  Previous CBC, BMP, TSH, RPR, HIV, CRP/sed rate, RF, A1c, B12 are all WNL from 2020-2022.  Without clear etiology, will refer to PM&R for nerve conduction study/evaluation.  Bilateral knee pain Unchanged and chronic.  Unfortunately she has not been able to have x-rays  performed yet, encouraged to have these completed.  Continue ice/heat, elevation, Tylenol/ibuprofen, and Voltaren gel with home PT exercises.  Vitamin D deficiency Vitamin D low at 35, has stayed persistently low despite appropriate supplementation.  Rx'd 50,000 IU X 8 weeks.  Recheck in 2 months.    Follow-up scheduled during appointment for 6/21 with myself.  Sooner if needed.  [name], DO [facility]

## 2020-11-29 ENCOUNTER — Encounter: Payer: Self-pay | Admitting: Family Medicine

## 2020-11-29 NOTE — Assessment & Plan Note (Signed)
Chronic history of intermittent burning sensation from her bilateral feet to knee level is overall suspicious for peripheral neuropathy.  However, still quite atypical given improvement with daily lotion use and appropriate shoe support.  Chronic knee pain may be contributing to this sensation as well.  Previous CBC, BMP, TSH, RPR, HIV, CRP/sed rate, RF, A1c, B12 are all WNL from 2020-2022.  Without clear etiology, will refer to PM&R for nerve conduction study/evaluation.

## 2020-11-29 NOTE — Assessment & Plan Note (Signed)
Vitamin D low at 19, has stayed persistently low despite appropriate supplementation.  Rx'd 50,000 IU X 8 weeks.  Recheck in 2 months.

## 2020-11-29 NOTE — Assessment & Plan Note (Signed)
Unchanged and chronic.  Unfortunately she has not been able to have x-rays performed yet, encouraged to have these completed.  Continue ice/heat, elevation, Tylenol/ibuprofen, and Voltaren gel with home PT exercises.

## 2020-12-11 ENCOUNTER — Ambulatory Visit
Admission: RE | Admit: 2020-12-11 | Discharge: 2020-12-11 | Disposition: A | Discharge status: Home / Self Care | Payer: Medicaid Other | Source: Ambulatory Visit | Attending: Family Medicine | Admitting: Family Medicine

## 2020-12-11 ENCOUNTER — Other Ambulatory Visit: Payer: Self-pay

## 2020-12-11 DIAGNOSIS — M1711 Unilateral primary osteoarthritis, right knee: Secondary | ICD-10-CM | POA: Diagnosis not present

## 2020-12-11 DIAGNOSIS — G8929 Other chronic pain: Secondary | ICD-10-CM

## 2020-12-11 DIAGNOSIS — M25562 Pain in left knee: Secondary | ICD-10-CM | POA: Diagnosis not present

## 2021-01-01 ENCOUNTER — Encounter: Payer: Self-pay | Admitting: Family Medicine

## 2021-01-01 ENCOUNTER — Other Ambulatory Visit: Payer: Self-pay

## 2021-01-01 ENCOUNTER — Ambulatory Visit: Payer: Medicaid Other | Admitting: Family Medicine

## 2021-01-01 VITALS — BP 108/64 | HR 84 | Ht 62.5 in | Wt 201.0 lb

## 2021-01-01 DIAGNOSIS — M25561 Pain in right knee: Secondary | ICD-10-CM | POA: Diagnosis not present

## 2021-01-01 DIAGNOSIS — G8929 Other chronic pain: Secondary | ICD-10-CM

## 2021-01-01 DIAGNOSIS — M25562 Pain in left knee: Secondary | ICD-10-CM | POA: Diagnosis not present

## 2021-01-01 DIAGNOSIS — R040 Epistaxis: Secondary | ICD-10-CM

## 2021-01-01 DIAGNOSIS — L239 Allergic contact dermatitis, unspecified cause: Secondary | ICD-10-CM

## 2021-01-01 DIAGNOSIS — G6289 Other specified polyneuropathies: Secondary | ICD-10-CM | POA: Diagnosis not present

## 2021-01-01 DIAGNOSIS — R21 Rash and other nonspecific skin eruption: Secondary | ICD-10-CM

## 2021-01-01 DIAGNOSIS — N3000 Acute cystitis without hematuria: Secondary | ICD-10-CM

## 2021-01-01 DIAGNOSIS — R300 Dysuria: Secondary | ICD-10-CM | POA: Diagnosis not present

## 2021-01-01 DIAGNOSIS — Z789 Other specified health status: Secondary | ICD-10-CM | POA: Diagnosis not present

## 2021-01-01 LAB — POCT URINALYSIS DIP (MANUAL ENTRY)
Bilirubin, UA: NEGATIVE
Glucose, UA: NEGATIVE mg/dL
Ketones, POC UA: NEGATIVE mg/dL
Leukocytes, UA: NEGATIVE
Nitrite, UA: NEGATIVE
Protein Ur, POC: NEGATIVE mg/dL
Spec Grav, UA: 1.015 (ref 1.010–1.025)
Urobilinogen, UA: 0.2 E.U./dL
pH, UA: 5 (ref 5.0–8.0)

## 2021-01-01 MED ORDER — TRIAMCINOLONE ACETONIDE 0.1 % EX OINT: 1.0000 "application " | TOPICAL_OINTMENT | Freq: Two times a day (BID) | CUTANEOUS | 1 refills | Status: DC

## 2021-01-01 MED ORDER — FLUCONAZOLE 150 MG PO TABS: 150.0000 mg | ORAL_TABLET | Freq: Once | ORAL | 0 refills | Status: AC

## 2021-01-01 MED ORDER — CEPHALEXIN 500 MG PO CAPS: 500.0000 mg | ORAL_CAPSULE | Freq: Two times a day (BID) | ORAL | 0 refills | Status: AC

## 2021-01-01 NOTE — Patient Instructions (Addendum)
Keep your prenatal vitamins.  Await call from rehab for your nerve study.  You can use the steroid cream twice daily for 2 weeks max on rash.

## 2021-01-01 NOTE — Progress Notes (Signed)
    SUBJECTIVE:   CHIEF COMPLAINT / HPI: Several concerns   Lorelei is a 35 year old female presenting to follow-up and to discuss the following concerns.  A tigrinian interpreter via iPad was used for the duration of visit.  Rash on her neck: left lateral neck, itchy. Located underneath a gold necklace that she wore recently for one night, rash started after wearing that. It has got better since not wearing it anymore.  No fever, drainage, or rash elsewhere.  Small nosebleeds over the past week.  Will see a little bit on the tissue, does not flow down her nose.  Hit her nose on a cabinet recently and wants to make sure her nose is okay.  No nasal pain or deformity. Trying to conceive, wants to know if there are any medications she should take for this.  Burning with urination over the past few days. Associated with urinary frequency, urgency, and suprapubic pressure. Some white vaginal discharge occasionally, no significant itching or irritation.  Sexually active with her husband only, not concern for STDs. She had heard on YouTube that [facility] and Vaseline shampoo could cause cancer, would like to discuss this further. Would like to know the results of her recent A1c/knee x-rays.  Knees are currently doing okay.  PERTINENT  PMH / PSH: Bilateral knee pain, vitamin D deficiency, possible peripheral neuropathy  OBJECTIVE:   BP 108/64   Pulse 84   Ht 5' 2.5" (1.588 m)   Wt 201 lb (91.2 kg)   LMP 12/29/2020   SpO2 98%   BMI 36.18 kg/m   General: Alert, NAD HEENT: NCAT, MMM, oropharynx nonerythematous, no nasal bone deformity, ecchymoses, or pain with palpation of nasal bridge.  Small amount of dried blood present in anterior mucosa predominantly on the right side, no active bleeding. Lungs: Clear bilaterally, no increased WOB  Msk: Normal gait Ext: Warm, dry, 2+ distal pulses Derm: Relatively localized rash to left lateral neck along necklace line, erythematous and papular in nature  without any puslike drainage or tenderness to palpation  ASSESSMENT/PLAN:   Contact dermatitis Likely allergic to necklace material.  Recommended avoiding wearing this necklace and purchasing hypoallergenic jewelry.  Rx'd triamcinolone to use twice daily up to 2 weeks, anticipate rash will improve with time as well.  Bilateral knee pain Chronic, currently tolerable.  Bilateral x-rays relatively unremarkable, some evidence of osteoarthritis that could be contributing.  Awaiting nerve conduction study for concurrent possible neuropathy  UTI (urinary tract infection) While U/a is overall unremarkable (currently on menstrual cycle), given classic UTI complaints, will go ahead and treat.  Unable to send for urine culture as lab was already closed.  Rx'd Keflex X 5 days and posttreatment Diflucan.  If not improving with this, recommended follow-up for full STD screen/pelvic exam.   Bleeding from the nose Very mild/anterior without active bleeding.  May been related to recent nose trauma vs allergies.  Patient is not interested in trialing any medications.  Provided reassurance and will observe.  Attempting to conceive Discussed continued use of prenatal vitamin and sexual activity around ovulation time.  Can follow-up with OB/GYN if unsuccessful despite these measures over the next year.    Follow-up in 1 month to check-in or sooner if needed.  [name], DO [facility]

## 2021-01-02 ENCOUNTER — Encounter: Payer: Self-pay | Admitting: Family Medicine

## 2021-01-02 DIAGNOSIS — L259 Unspecified contact dermatitis, unspecified cause: Secondary | ICD-10-CM | POA: Insufficient documentation

## 2021-01-02 DIAGNOSIS — R040 Epistaxis: Secondary | ICD-10-CM | POA: Insufficient documentation

## 2021-01-02 DIAGNOSIS — N390 Urinary tract infection, site not specified: Secondary | ICD-10-CM | POA: Insufficient documentation

## 2021-01-02 NOTE — Assessment & Plan Note (Signed)
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  Recommended avoiding wearing this necklace and purchasing hypoallergenic jewelry.  Rx'd triamcinolone to use twice daily up to 2 weeks, anticipate rash will improve with time as well.

## 2021-01-02 NOTE — Assessment & Plan Note (Signed)
Very mild/anterior without active bleeding.  May been related to recent nose trauma vs allergies.  Patient is not interested in trialing any medications.  Provided reassurance and will observe.

## 2021-01-02 NOTE — Assessment & Plan Note (Signed)
Discussed continued use of prenatal vitamin and sexual activity around ovulation time.  Can follow-up with OB/GYN if unsuccessful despite these measures over the next year.

## 2021-01-02 NOTE — Assessment & Plan Note (Signed)
While U/a is overall unremarkable (currently on menstrual cycle), given classic UTI complaints, will go ahead and treat.  Unable to send for urine culture as lab was already closed.  Rx'd Keflex X 5 days and posttreatment Diflucan.  If not improving with this, recommended follow-up for full STD screen/pelvic exam.

## 2021-01-02 NOTE — Assessment & Plan Note (Signed)
Chronic, currently tolerable.  Bilateral x-rays relatively unremarkable, some evidence of osteoarthritis that could be contributing.  Awaiting nerve conduction study for concurrent possible neuropathy

## 2021-02-05 ENCOUNTER — Other Ambulatory Visit: Payer: Self-pay | Admitting: Family Medicine

## 2021-02-05 DIAGNOSIS — E559 Vitamin D deficiency, unspecified: Secondary | ICD-10-CM

## 2021-06-03 ENCOUNTER — Ambulatory Visit: Payer: Medicaid Other | Admitting: Obstetrics and Gynecology

## 2021-06-14 ENCOUNTER — Other Ambulatory Visit: Payer: Self-pay

## 2021-06-14 ENCOUNTER — Encounter (HOSPITAL_COMMUNITY): Payer: Self-pay

## 2021-06-14 ENCOUNTER — Ambulatory Visit (HOSPITAL_COMMUNITY)
Admission: EM | Admit: 2021-06-14 | Discharge: 2021-06-14 | Disposition: A | Discharge status: Home / Self Care | Payer: Medicaid Other | Attending: Student | Admitting: Student

## 2021-06-14 DIAGNOSIS — J069 Acute upper respiratory infection, unspecified: Secondary | ICD-10-CM

## 2021-06-14 DIAGNOSIS — Z789 Other specified health status: Secondary | ICD-10-CM | POA: Diagnosis not present

## 2021-06-14 MED ORDER — DM-GUAIFENESIN ER 30-600 MG PO TB12: 1.0000 | ORAL_TABLET | Freq: Two times a day (BID) | ORAL | 0 refills | Status: DC

## 2021-06-14 NOTE — Discharge Instructions (Addendum)
-  Mucinex twice daily  -With a virus, you're typically contagious for 5-7 days, or as long as you're having fevers.

## 2021-06-14 NOTE — ED Triage Notes (Signed)
Pt presents with c/o fever, headache and abdominal pin x 2 days.   States she has not been able to eat or drink.

## 2021-06-14 NOTE — ED Provider Notes (Signed)
[place]    CSN: [id number] Arrival date & time: 06/14/21  1112      History   Chief Complaint Chief Complaint  Patient presents with   Fever   Headache   Abdominal Pain    HPI Joanna Ashley is a 35 y.o. female presenting with viral syndrome x2 days. Medical history noncontributory. Spoke with this patient using [facility] interpreter. Describes nausea without vomiting or abd pain or diarrhea. Nonproductive cough. Nasal congestion. Denies sick contacts. Decreased appetite but tolerating fluids. Hasn't attempted any medications as she wanted to speak with a physician first.  HPI  Past Medical History:  Diagnosis Date   Anxiety    Chronic tension headaches 08/22/2019    Patient Active Problem List   Diagnosis Date Noted   Contact dermatitis 01/02/2021   UTI (urinary tract infection) 01/02/2021   Bleeding from the nose 01/02/2021   Peripheral neuropathy 11/09/2020   Dysmenorrhea 04/26/2020   Attempting to conceive 04/26/2020   Bilateral knee pain 01/04/2020   Vitamin D deficiency 03/18/2019   COVID-19 virus detected 03/18/2019    History reviewed. No pertinent surgical history.  OB History     Gravida  2   Para  2   Term  2   Preterm      AB      Living  2      SAB      IAB      Ectopic      Multiple      Live Births  2            Home Medications    Prior to Admission medications   Medication Sig Start Date End Date Taking? Authorizing Provider  dextromethorphan-guaiFENesin (MUCINEX DM) 30-600 MG 12hr tablet Take 1 tablet by mouth 2 (two) times daily. 06/14/21  Yes Hazel Sams, PA-C  Cholecalciferol (VITAMIN D-3) 25 MCG (1000 UT) CAPS Take 1 capsule (1,000 Units total) by mouth daily. 03/07/20   Patriciaann Clan, DO  Prenatal Vit-Fe Fumarate-FA (PREPLUS) 27-1 MG TABS Take 1 tablet by mouth daily. 09/03/20   Constant, Peggy, MD  triamcinolone ointment (KENALOG) 0.1 % Apply 1 application topically 2 (two) times daily. 1-2 weeks  01/01/21   Patriciaann Clan, DO    Family History History reviewed. No pertinent family history.  Social History Social History   Tobacco Use   Smoking status: Never   Smokeless tobacco: Never  Vaping Use   Vaping Use: Never used  Substance Use Topics   Alcohol use: No   Drug use: No     Allergies   Other   Review of Systems Review of Systems  Constitutional:  Negative for appetite change, chills and fever.  HENT:  Positive for congestion. Negative for ear pain, rhinorrhea, sinus pressure, sinus pain and sore throat.   Eyes:  Negative for redness and visual disturbance.  Respiratory:  Positive for cough. Negative for chest tightness, shortness of breath and wheezing.   Cardiovascular:  Negative for chest pain and palpitations.  Gastrointestinal:  Negative for abdominal pain, constipation, diarrhea, nausea and vomiting.  Genitourinary:  Negative for dysuria, frequency and urgency.  Musculoskeletal:  Negative for myalgias.  Neurological:  Negative for dizziness, weakness and headaches.  Psychiatric/Behavioral:  Negative for confusion.   All other systems reviewed and are negative.   Physical Exam Triage Vital Signs ED Triage Vitals  Enc Vitals Group     BP 06/14/21 1331 (!) 120/51     Pulse Rate  06/14/21 1330 77     Resp 06/14/21 1330 18     Temp 06/14/21 1330 99.2 F (37.3 C)     Temp Source 06/14/21 1330 Oral     SpO2 06/14/21 1330 98 %     Weight --      Height --      Head Circumference --      Peak Flow --      Pain Score 06/14/21 1328 3     Pain Loc --      Pain Edu? --      Excl. in GC? --    No data found.  Updated Vital Signs BP (!) 120/51 (BP Location: Left Arm)   Pulse 77   Temp 99.2 F (37.3 C) (Oral)   Resp 18   LMP  (LMP Unknown)   SpO2 98%   Visual Acuity Right Eye Distance:   Left Eye Distance:   Bilateral Distance:    Right Eye Near:   Left Eye Near:    Bilateral Near:     Physical Exam Vitals reviewed.  Constitutional:       General: She is not in acute distress.    Appearance: Normal appearance. She is not ill-appearing.  HENT:     Head: Normocephalic and atraumatic.     Right Ear: Tympanic membrane, ear canal and external ear normal. No tenderness. No middle ear effusion. There is no impacted cerumen. Tympanic membrane is not perforated, erythematous, retracted or bulging.     Left Ear: Tympanic membrane, ear canal and external ear normal. No tenderness.  No middle ear effusion. There is no impacted cerumen. Tympanic membrane is not perforated, erythematous, retracted or bulging.     Nose: Nose normal. No congestion.     Mouth/Throat:     Mouth: Mucous membranes are moist.     Pharynx: Uvula midline. No oropharyngeal exudate or posterior oropharyngeal erythema.  Eyes:     Extraocular Movements: Extraocular movements intact.     Pupils: Pupils are equal, round, and reactive to light.  Cardiovascular:     Rate and Rhythm: Normal rate and regular rhythm.     Heart sounds: Normal heart sounds.  Pulmonary:     Effort: Pulmonary effort is normal.     Breath sounds: Normal breath sounds. No decreased breath sounds, wheezing, rhonchi or rales.  Abdominal:     Palpations: Abdomen is soft.     Tenderness: There is no abdominal tenderness. There is no guarding or rebound.  Lymphadenopathy:     Cervical: No cervical adenopathy.     Right cervical: No superficial cervical adenopathy.    Left cervical: No superficial cervical adenopathy.  Neurological:     General: No focal deficit present.     Mental Status: She is alert and oriented to person, place, and time.  Psychiatric:        Mood and Affect: Mood normal.        Behavior: Behavior normal.        Thought Content: Thought content normal.        Judgment: Judgment normal.     UC Treatments / Results  Labs (all labs ordered are listed, but only abnormal results are displayed) Labs Reviewed - No data to display  EKG   Radiology No results  found.  Procedures Procedures (including critical care time)  Medications Ordered in UC Medications - No data to display  Initial Impression / Assessment and Plan / UC Course  I have reviewed the  triage vital signs and the nursing notes.  Pertinent labs & imaging results that were available during my care of the patient were reviewed by me and considered in my medical decision making (see chart for details).     This patient is a very pleasant 35 y.o. year old female presenting with viral URI with cough. Today this pt is afebrile nontachycardic nontachypneic, oxygenating well on room air, no wheezes rhonchi or rales. States she is not pregnant or breastfeeding.  Considering mild symptoms did not send a covid/influenza test.  Mucinex sent.   Work note provided. ED return precautions discussed. Patient verbalizes understanding and agreement.   Spoke with this patient using tigrinian interpreter.    Final Clinical Impressions(s) / UC Diagnoses   Final diagnoses:  Viral URI with cough  Language barrier     Discharge Instructions      -Mucinex twice daily  -With a virus, you're typically contagious for 5-7 days, or as long as you're having fevers.       ED Prescriptions     Medication Sig Dispense Auth. Provider   dextromethorphan-guaiFENesin (MUCINEX DM) 30-600 MG 12hr tablet Take 1 tablet by mouth 2 (two) times daily. 12 tablet Hazel Sams, PA-C      PDMP not reviewed this encounter.   Hazel Sams, PA-C 06/14/21 1416

## 2021-10-23 ENCOUNTER — Emergency Department (HOSPITAL_COMMUNITY)
Admission: EM | Admit: 2021-10-23 | Discharge: 2021-10-23 | Disposition: A | Discharge status: Home / Self Care | Payer: Medicaid Other | Attending: Emergency Medicine | Admitting: Emergency Medicine

## 2021-10-23 ENCOUNTER — Encounter (HOSPITAL_COMMUNITY): Payer: Self-pay | Admitting: *Deleted

## 2021-10-23 ENCOUNTER — Other Ambulatory Visit: Payer: Self-pay

## 2021-10-23 ENCOUNTER — Emergency Department (HOSPITAL_COMMUNITY): Payer: Medicaid Other

## 2021-10-23 DIAGNOSIS — K029 Dental caries, unspecified: Secondary | ICD-10-CM | POA: Diagnosis not present

## 2021-10-23 DIAGNOSIS — K0889 Other specified disorders of teeth and supporting structures: Secondary | ICD-10-CM | POA: Diagnosis not present

## 2021-10-23 DIAGNOSIS — R519 Headache, unspecified: Secondary | ICD-10-CM | POA: Diagnosis present

## 2021-10-23 DIAGNOSIS — R221 Localized swelling, mass and lump, neck: Secondary | ICD-10-CM | POA: Diagnosis not present

## 2021-10-23 DIAGNOSIS — R220 Localized swelling, mass and lump, head: Secondary | ICD-10-CM | POA: Diagnosis not present

## 2021-10-23 DIAGNOSIS — Z8739 Personal history of other diseases of the musculoskeletal system and connective tissue: Secondary | ICD-10-CM | POA: Diagnosis not present

## 2021-10-23 LAB — CBC WITH DIFFERENTIAL/PLATELET
Abs Immature Granulocytes: 0.02 10*3/uL (ref 0.00–0.07)
Basophils Absolute: 0 10*3/uL (ref 0.0–0.1)
Basophils Relative: 1 %
Eosinophils Absolute: 0.1 10*3/uL (ref 0.0–0.5)
Eosinophils Relative: 2 %
HCT: 37.4 % (ref 36.0–46.0)
Hemoglobin: 12.9 g/dL (ref 12.0–15.0)
Immature Granulocytes: 0 %
Lymphocytes Relative: 33 %
Lymphs Abs: 2.1 10*3/uL (ref 0.7–4.0)
MCH: 31.2 pg (ref 26.0–34.0)
MCHC: 34.5 g/dL (ref 30.0–36.0)
MCV: 90.3 fL (ref 80.0–100.0)
Monocytes Absolute: 0.6 10*3/uL (ref 0.1–1.0)
Monocytes Relative: 9 %
Neutro Abs: 3.5 10*3/uL (ref 1.7–7.7)
Neutrophils Relative %: 55 %
Platelets: 384 10*3/uL (ref 150–400)
RBC: 4.14 MIL/uL (ref 3.87–5.11)
RDW: 11.9 % (ref 11.5–15.5)
WBC: 6.4 10*3/uL (ref 4.0–10.5)
nRBC: 0 % (ref 0.0–0.2)

## 2021-10-23 LAB — BASIC METABOLIC PANEL
Anion gap: 7 (ref 5–15)
BUN: 6 mg/dL (ref 6–20)
CO2: 25 mmol/L (ref 22–32)
Calcium: 9.2 mg/dL (ref 8.9–10.3)
Chloride: 106 mmol/L (ref 98–111)
Creatinine, Ser: 0.45 mg/dL (ref 0.44–1.00)
GFR, Estimated: >60 mL/min (ref >60)
Glucose, Bld: 98 mg/dL (ref 70–99)
Potassium: 3.9 mmol/L (ref 3.5–5.1)
Sodium: 138 mmol/L (ref 135–145)

## 2021-10-23 LAB — I-STAT BETA HCG BLOOD, ED (MC, WL, AP ONLY): I-stat hCG, quantitative: <5 m[IU]/mL (ref <5)

## 2021-10-23 MED ORDER — IOHEXOL 300 MG/ML  SOLN
75.0000 mL | Freq: Once | INTRAMUSCULAR | Status: AC | PRN
  Administered 2021-10-23: 75 mL via INTRAVENOUS

## 2021-10-23 MED ORDER — CLINDAMYCIN HCL 300 MG PO CAPS: 300.0000 mg | ORAL_CAPSULE | Freq: Three times a day (TID) | ORAL | 0 refills | Status: AC

## 2021-10-23 MED ORDER — ONDANSETRON HCL 4 MG PO TABS: 4.0000 mg | ORAL_TABLET | Freq: Four times a day (QID) | ORAL | 0 refills | Status: DC

## 2021-10-23 NOTE — ED Provider Notes (Signed)
?[facility] EMERGENCY DEPARTMENT ?Provider Note ? ? ?CSN: [id number] ?Arrival date & time: 10/23/21  0809 ? ?  ? ?History ? ?Chief Complaint  ?Patient presents with  ? Facial Pain  ? ? ?Joanna Ashley is a 36 y.o. female. ? ?Here with left-sided facial pain for the last several days.  Denies any active fever or chills.  Feels like she is having pain mostly in her left teeth.  Hurts when she chews.  Denies any difficulty opening mouth.  Some discomfort with eating.  No drooling, no history of trauma.  No major medical problems otherwise. ? ? ? ?  ? ?Home Medications ?Prior to Admission medications   ?Medication Sig Start Date End Date Taking? Authorizing Provider  ?clindamycin (CLEOCIN) 300 MG capsule Take 1 capsule (300 mg total) by mouth 3 (three) times daily for 7 days. 10/23/21 10/30/21 Yes Giannie Soliday, DO  ?ondansetron (ZOFRAN) 4 MG tablet Take 1 tablet (4 mg total) by mouth every 6 (six) hours. 10/23/21  Yes Virgina Norfolk, DO  ?Cholecalciferol (VITAMIN D-3) 25 MCG (1000 UT) CAPS Take 1 capsule (1,000 Units total) by mouth daily. 03/07/20   [name], DO  ?dextromethorphan-guaiFENesin (MUCINEX DM) 30-600 MG 12hr tablet Take 1 tablet by mouth 2 (two) times daily. 06/14/21   Rhys Martini, PA-C  ?Prenatal Vit-Fe Fumarate-FA (PREPLUS) 27-1 MG TABS Take 1 tablet by mouth daily. 09/03/20   Constant, Peggy, MD  ?triamcinolone ointment (KENALOG) 0.1 % Apply 1 application topically 2 (two) times daily. 1-2 weeks 01/01/21   [name], DO  ?   ? ?Allergies    ?Other   ? ?Review of Systems   ?Review of Systems ? ?Physical Exam ?Updated Vital Signs ?BP 103/68   Pulse 65   Temp 97.7 ?F (36.5 ?C) (Oral)   Resp 18   SpO2 97%  ?Physical Exam ?Vitals and nursing note reviewed.  ?Constitutional:   ?   General: She is not in acute distress. ?   Appearance: She is well-developed.  ?HENT:  ?   Head: Normocephalic and atraumatic.  ?   Comments: Multiple dental caries, no obvious large facial swelling  or abscess in the oropharynx, submandibular space is soft ?   Nose: Nose normal.  ?   Mouth/Throat:  ?   Mouth: Mucous membranes are moist.  ?Eyes:  ?   Extraocular Movements: Extraocular movements intact.  ?   Pupils: Pupils are equal, round, and reactive to light.  ?Cardiovascular:  ?   Rate and Rhythm: Normal rate and regular rhythm.  ?   Heart sounds: No murmur heard. ?Pulmonary:  ?   Effort: Pulmonary effort is normal. No respiratory distress.  ?   Breath sounds: Normal breath sounds.  ?Abdominal:  ?   Palpations: Abdomen is soft.  ?   Tenderness: There is no abdominal tenderness.  ?Musculoskeletal:     ?   General: No swelling.  ?   Cervical back: Neck supple.  ?Skin: ?   General: Skin is warm and dry.  ?   Capillary Refill: Capillary refill takes less than 2 seconds.  ?Neurological:  ?   Mental Status: She is alert.  ?Psychiatric:     ?   Mood and Affect: Mood normal.  ? ? ?ED Results / Procedures / Treatments   ?Labs ?(all labs ordered are listed, but only abnormal results are displayed) ?Labs Reviewed  ?CBC WITH DIFFERENTIAL/PLATELET  ?BASIC METABOLIC PANEL  ?I-STAT BETA HCG BLOOD, ED ([facility], [facility],  [facility] ONLY)  ? ? ?EKG ?None ? ?Radiology ?CT Soft Tissue Neck W Contrast ? ?Result Date: 10/23/2021 ?CLINICAL DATA:  Left facial pain and swelling EXAM: CT NECK WITH CONTRAST TECHNIQUE: Multidetector CT imaging of the neck was performed using the standard protocol following the bolus administration of intravenous contrast. RADIATION DOSE REDUCTION: This exam was performed according to the departmental dose-optimization program which includes automated exposure control, adjustment of the mA and/or kV according to patient size and/or use of iterative reconstruction technique. CONTRAST:  28mL OMNIPAQUE IOHEXOL 300 MG/ML  SOLN COMPARISON:  None. FINDINGS: Pharynx and larynx: Unremarkable.  No mass or swelling. Salivary glands: Parotid and submandibular glands are unremarkable. Thyroid: Normal. Lymph nodes: No enlarged or  abnormal density nodes. Vascular: Major neck vessels are patent. Limited intracranial: No abnormal enhancement. Visualized orbits: Unremarkable. Mastoids and visualized paranasal sinuses: No significant opacification. Skeleton: No acute or significant osseous abnormality. Upper chest: Included upper lungs are clear. Other: None. IMPRESSION: No neck mass, adenopathy, or significant inflammatory changes. Electronically Signed   By: Guadlupe Spanish M.D.   On: 10/23/2021 10:16   ? ?Procedures ?Procedures  ? ? ?Medications Ordered in ED ?Medications  ?iohexol (OMNIPAQUE) 300 MG/ML solution 75 mL (75 mLs Intravenous Contrast Given 10/23/21 1003)  ? ? ?ED Course/ Medical Decision Making/ A&P ?  ?                        ?Medical Decision Making ? ?Joanna Ashley is here with left-sided facial pain.  Ongoing for the last several days.  Pain mostly in the left lower teeth.  Patient has already had basic labs and CT scan of her face done prior to my evaluation.  She has no signs of obvious infectious process on exam.  Submandibular space is soft.  My suspicion that differential is likely dental related pain versus TMJ.  Per radiology report of CT scan there is no signs of infectious process or inflammatory process.  Per my review and interpretation of labs there is no significant anemia, leukocytosis, electrolyte abnormality. ? ?We will start her on antibiotics and give Zofran to use as needed.  We will refer her to dentistry.  If that work-up is unremarkable may need referral to ENT for TMJ.  Discharged in good condition. ? ?This chart was dictated using voice recognition software.  Despite best efforts to proofread,  errors can occur which can change the documentation meaning.  ? ? ? ? ? ? ? ?Final Clinical Impression(s) / ED Diagnoses ?Final diagnoses:  ?Pain due to dental caries  ? ? ?Rx / DC Orders ?ED Discharge Orders   ? ?      Ordered  ?  clindamycin (CLEOCIN) 300 MG capsule  3 times daily       ? 10/23/21 1553  ?   ondansetron (ZOFRAN) 4 MG tablet  Every 6 hours       ? 10/23/21 1553  ? ?  ?  ? ?  ? ? ?  ?Virgina Norfolk, DO ?10/23/21 1553 ? ?

## 2021-10-23 NOTE — ED Provider Triage Note (Signed)
Emergency Medicine Provider Triage Evaluation Note ? ?Joanna Ashley , a 36 y.o. female  was evaluated in triage.  Pt complains of left-sided facial pain and swelling with difficulty swallowing, nosebleeds and mouth bleeding.  Audio interpreter utilized during exam.  She states that she has had pain in her left teeth for a few weeks but swelling worsened over the past 2 to 3 days.  She describes a sharp pain when she swallows and states that it is difficult to talk.  Pain is worse when she looks left and right.  She reports subjective fever this morning. ? ?Review of Systems  ?Positive: Facial swelling ?Negative: Vomiting ? ?Physical Exam  ?BP 106/70 (BP Location: Right Arm)   Pulse 65   Temp 97.7 ?F (36.5 ?C) (Oral)   Resp 17   SpO2 100%  ?Gen:   Awake, no distress   ?Resp:  Normal effort  ?MSK:   Moves extremities without difficulty  ?Other:  Diffuse facial swelling over the left mandible and left neck without discrete abscess ? ?Tooth #19 missing, no large cavities ? ?Medical Decision Making  ?Medically screening exam initiated at 8:30 AM.  Appropriate orders placed.  Omaria Temme was informed that the remainder of the evaluation will be completed by another provider, this initial triage assessment does not replace that evaluation, and the importance of remaining in the ED until their evaluation is complete. ? ?Labs, soft tissue neck CT ordered.  This may just be a periapical abscess or infection, but given reported fever, neck pain, difficulty swallowing, feel that it is prudent to better evaluate the extent of infection. ?  Carlisle Cater, PA-C ?10/23/21 [id number] ? ?

## 2021-10-23 NOTE — ED Triage Notes (Signed)
Interpretor used, reports left side dental pain. Having bleeding and n/v. Reports + fever. Airway intact at triage. ?

## 2021-10-24 ENCOUNTER — Telehealth: Payer: Self-pay

## 2021-10-24 NOTE — Telephone Encounter (Signed)
Transition Care Management Follow-up Telephone Call ?Date of discharge and from where: 10/23/2021-[place]  ?How have you been since you were released from the hospital? Pt stated she is doing fine  ?Any questions or concerns? No ? ?Items Reviewed: ?Did the pt receive and understand the discharge instructions provided? Yes  ?Medications obtained and verified? Yes  ?Other? No  ?Any new allergies since your discharge? No  ?Dietary orders reviewed? No ?Do you have support at home? Yes  ? ?Home Care and Equipment/Supplies: ?Were home health services ordered? not applicable ?If so, what is the name of the agency? N/A  ?Has the agency set up a time to come to the patient's home? not applicable ?Were any new equipment or medical supplies ordered?  No ?What is the name of the medical supply agency? N/A ?Were you able to get the supplies/equipment? not applicable ?Do you have any questions related to the use of the equipment or supplies? No ? ?Functional Questionnaire: (I = Independent and D = Dependent) ?ADLs: I ? ?Bathing/Dressing- I ? ?Meal Prep- I ? ?Eating- I ? ?Maintaining continence- I ? ?Transferring/Ambulation- I ? ?Managing Meds- I ? ?Follow up appointments reviewed: ? ?PCP Hospital f/u appt confirmed? Yes  Scheduled to see Dr. Arby Barrette on 10/30/2021.  ?[facility] f/u appt confirmed? No   ?Are transportation arrangements needed? No  ?If their condition worsens, is the pt aware to call PCP or go to the Emergency Dept.? Yes ?Was the patient provided with contact information for the PCP's office or ED? Yes ?Was to pt encouraged to call back with questions or concerns? Yes  ?

## 2021-10-30 ENCOUNTER — Ambulatory Visit: Payer: Medicaid Other | Admitting: Family Medicine

## 2021-11-04 ENCOUNTER — Encounter: Payer: Self-pay | Admitting: Family Medicine

## 2021-11-04 ENCOUNTER — Ambulatory Visit: Payer: Medicaid Other | Admitting: Family Medicine

## 2021-11-04 VITALS — BP 118/74 | HR 77 | Ht 62.5 in | Wt 201.4 lb

## 2021-11-04 DIAGNOSIS — G8929 Other chronic pain: Secondary | ICD-10-CM

## 2021-11-04 DIAGNOSIS — M25562 Pain in left knee: Secondary | ICD-10-CM | POA: Diagnosis not present

## 2021-11-04 DIAGNOSIS — M25561 Pain in right knee: Secondary | ICD-10-CM | POA: Diagnosis not present
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
## 2021-11-04 NOTE — Patient Instructions (Addendum)
It was great seeing you today! ? ?We are referring you to sports medicine clinic for your ongoing knee and ankle pain. They will call you to schedule an appointment.  ? ?I'd like to see you back in about 2 weeks for follow up and to address items we but if you need to be seen earlier than that for any new issues we're happy to fit you in, just give [place] a call! ? ?Feel free to call with any questions or concerns at any time, at [phone]. ?  ?Take care,  ?Dr. Cora Collum ?[facility]  ? ? ??? ?????? ??? ???! ? ???? ??? ?????? ?????????? ?? ???? ???? ???? ?????? ???? ??? ???? ?????? ???? ? ???? ???? 2 ??? ??????? ????? ??????? ?????? ?? ???? ?? ??? ?????? ????? ????? ?????? ???? ??? ???? ???????? ???? ??? ???? ??? ???! ? ???? ?? ?? ???? ?? ?????? ?? ??? ??? ????? ?? ???? ? [phone]? ?  ??????, ????? ????? ?? ?? ????? ???? ????? ??? ?? ?

## 2021-11-06 NOTE — Progress Notes (Signed)
? ? ?  SUBJECTIVE:  ? ?CHIEF COMPLAINT / HPI:  ? ?Joanna Ashley is a 36 y.o. who presents for a check up. IPAD [place] interpreter used. She unfortunately arrived 30 minutes late and had a shortened visit.  ? ?She states there was a knee X ray done that she wants reults on. Images from last year showing R knee with minimal patellofemoral degenerative change and L knee normal  ? ?Still preents with continued anterior knee pain that comes and goes and worse when cold. Sometimes worse when going up and down states. Larey Seat on right sided knee in 2016 and with continued pain. She asks about a referral that was placed by Dr. Annia Friendly. She is wanting physical therapy ? ? ?OBJECTIVE:  ? ?BP 118/74   Pulse 77   Ht 5' 2.5" (1.588 m)   Wt 201 lb 6.4 oz (91.4 kg)   LMP  (LMP Unknown)   SpO2 100%   BMI 36.25 kg/m?   ? ?General: alert, NAD ?CV: RRR no murmurs  ?Resp: CTAB normal WOB ?GI: soft, non distended  ?MSK: Knees non edematous. Non tender to palpation along medial and lateral joint lines bilaterally. Full flexion and extension. Gait normal  ?Extremities: warm, dry. No LE edema  ?MSK: Knees non edematous. Gait normal  ? ?ASSESSMENT/PLAN:  ? ?Bilateral knee pain ?Patient presents for continued knee pain that she was seen in the clinic for 6 months ago. X rays showed R knee with minimal patellofemoral degenerative change and L knee normal. She was referred to PM&R for continued therapy in addition to her home exercises and conservative management. Given referral was a year ago, placed new referral to sports medicine and recommended continued supportive care.  ?  ? ? ?Cora Collum, DO ?[facility]   ?

## 2021-11-06 NOTE — Assessment & Plan Note (Signed)
Patient presents for continued knee pain that she was seen in the clinic for 6 months ago. X rays showed R knee with minimal patellofemoral degenerative change and L knee normal. She was referred to PM&R for continued therapy in addition to her home exercises and conservative management. Given referral was a year ago, placed new referral to sports medicine and recommended continued supportive care.  ?

## 2021-11-08 ENCOUNTER — Ambulatory Visit: Payer: Medicaid Other | Admitting: Family Medicine

## 2021-11-08 VITALS — BP 115/71 | Ht 62.5 in | Wt 201.0 lb

## 2021-11-08 DIAGNOSIS — M222X1 Patellofemoral disorders, right knee: Secondary | ICD-10-CM

## 2021-11-08 MED ORDER — TRAMADOL HCL 50 MG PO TABS: ORAL_TABLET | ORAL | 0 refills | Status: DC

## 2021-11-08 NOTE — Patient Instructions (Signed)
Your knee pain is caused by something that is common, patellofemoral syndrome.  We talked about possibly doing exercises at home versus formal physical therapy and you chose formal PT so we have put a referral in for that.  The rehabilitation from exercises and physical therapy can sometimes increase your pain so I am giving you small number of pain medicines.  You can take 1 a day.  If you get pregnant, please stop this medicine immediately.  Let me see you back in 4 to 6 weeks. ?

## 2021-11-08 NOTE — Progress Notes (Signed)
?  Joanna Ashley - 36 y.o. female MRN [id number]  Date of birth: [birth year] ? ? ? ?SUBJECTIVE:    ?  ?Chief Complaint:/ HPI:  ? 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  Entire visit conducted with use of video interpreter ?R > L but B knee pain. Right is bothering her most now and typically the right is worse.Pain is ongoing 3 years. Worse with stairs and standiong.  Anterior knee but also aches deep inside the joint.  Sometimes it bothers her at night.  Makes the whole leg feel uncomfortable.  No giving way although he occasionally feels slightly unstable.  She recalls no specific incident to either knee.  She has had x-rays. ? ?No other specific joint problems that she is aware of. ? ?She has previously had physical therapy for for knee pain and said that really helped quite a bit.  Evidently used some type of TENS unit on her and she was on the stationary cycle.  She would like to try that again. ? ?IMAGING: 4 view complete knee of both right and left done on 530 12022: I reviewed the images. ?I agree with the findings of some moderate patellofemoral degenerative changes on the right knee.  She also has slightly laterally slanted patella more notable on the right.  Mild joint space narrowing laterallySeen on the right as well. ?OBJECTIVE: BP 115/71   Ht 5' 2.5" (1.588 m)   Wt 201 lb (91.2 kg)   LMP  (LMP Unknown)   BMI 36.18 kg/m?   ?Physical Exam:  Vital signs are reviewed. ?GENERAL: Well-developed female no acute distress ?BMI 36 ?KNEES: Symmetrical.  There is some slight synovial hypertrophy of the right and she has laterally angled patella bilaterally worse on the right.  Patellar grind test very positive on the right in moderately painful on the left.  Patella seems to be without any unusual laxity.  She has no medial or lateral joint line tenderness on either knee.  She is ligamentously intact varus and valgus stress bilaterally.  Normal Lachman.  Popliteal space is benign bilaterally. ?Distally she is  neurovascular intact. ?Quadricep tone is normal but bulk is minimal to moderate.  The VMO is not well-defined on either leg. ? ?ASSESSMENT & PLAN: ? ?See problem based charting & AVS for pt instructions. ?Patellofemoral pain syndrome of right knee ?Initially was discussed doing home exercise program but she would really like to go back to physical therapy so we will set that up.  I will see her in 4 to 6 weeks.  All questions were answered to her satisfaction. ?Also gave her a small number of tramadol.  I am concerned that when she starts back into PT she will have worsening pain.  I asked her about the notation in her chart that she was attempting to conceive and she said that she would not mind having a baby that she was not using current birth control.  Should she become pregnant, I would have her stop the tramadol.  For this reason I did not give her NSAIDs. ? ?

## 2021-11-08 NOTE — Assessment & Plan Note (Addendum)
Initially was discussed doing home exercise program but she would really like to go back to physical therapy so we will set that up.  I will see her in 4 to 6 weeks.  All questions were answered to her satisfaction. ?Also gave her a small number of tramadol.  I am concerned that when she starts back into PT she will have worsening pain.  I asked her about the notation in her chart that she was attempting to conceive and she said that she would not mind having a baby that she was not using current birth control.  Should she become pregnant, I would have her stop the tramadol.  For this reason I did not give her NSAIDs. ?

## 2021-11-11 ENCOUNTER — Other Ambulatory Visit (HOSPITAL_COMMUNITY): Payer: Self-pay

## 2021-11-11 ENCOUNTER — Telehealth: Payer: Self-pay

## 2021-11-11 NOTE — Telephone Encounter (Signed)
A Prior Authorization was initiated for this patients TRAMADOL through CoverMyMeds.  ? ?Key: [id number] ? ?

## 2021-11-13 NOTE — Telephone Encounter (Signed)
Prior Auth for patients medication TRAMADOL approved by [employer] from 11/12/21 to 05/11/22. ? ?Key: [id number] ? ?Patients pharmacy notified. ? ?

## 2021-11-19 ENCOUNTER — Ambulatory Visit: Payer: Medicaid Other | Attending: Family Medicine | Admitting: Physical Therapy

## 2021-11-19 NOTE — Therapy (Incomplete)
?OUTPATIENT PHYSICAL THERAPY LOWER EXTREMITY EVALUATION ? ? ?Patient Name: Joanna Ashley ?MRN: [id number] ?DOB:19-Apr-1986, 36 y.o., female ?Today's Date: 11/19/2021 ? ? ? ?Past Medical History:  ?Diagnosis Date  ? Anxiety   ? Chronic tension headaches 08/22/2019  ? ?No past surgical history on file. ?Patient Active Problem List  ? Diagnosis Date Noted  ? Patellofemoral pain syndrome of right knee 11/08/2021  ? Contact dermatitis 01/02/2021  ? UTI (urinary tract infection) 01/02/2021  ? Bleeding from the nose 01/02/2021  ? Peripheral neuropathy 11/09/2020  ? Dysmenorrhea 04/26/2020  ? Attempting to conceive 04/26/2020  ? Bilateral knee pain 01/04/2020  ? Vitamin D deficiency 03/18/2019  ? COVID-19 virus detected 03/18/2019  ? ? ?PCP: Cora Collum, DO ? ?REFERRING PROVIDER: Patellofemoral pain syndrome of right knee [M22.2X1] ? ?REFERRING DIAG: Nestor Ramp, MD ? ?THERAPY DIAG:  ?No diagnosis found. ? ?ONSET DATE: *** ? ?SUBJECTIVE:  ? ?SUBJECTIVE STATEMENT: ?*** ? ?PERTINENT HISTORY: ?*** ? ?PAIN:  ?Are you having pain? {OPRCPAIN:27236} ? ?PRECAUTIONS: {Therapy precautions:24002} ? ?WEIGHT BEARING RESTRICTIONS {Yes ***/No:24003} ? ?FALLS:  ?Has patient fallen in last 6 months? {fallsyesno:27318} ? ?LIVING ENVIRONMENT: ?Lives with: {OPRC lives with:25569::"lives with their family"} ?Lives in: {Lives in:25570} ?Stairs: {opstairs:27293} ?Has following equipment at home: {Assistive devices:23999} ? ?OCCUPATION: *** ? ?PLOF: {PLOF:24004} ? ?PATIENT GOALS *** ? ? ?OBJECTIVE:  ? ?DIAGNOSTIC FINDINGS: *** ? ?PATIENT SURVEYS:  ?LEFS *** ? ?COGNITION: ? Overall cognitive status: {cognition:24006}   ?  ?SENSATION: ?{sensation:27233} ? ?MUSCLE LENGTH: ?Hamstrings: Right *** deg; Left *** deg ?Thomas test: Right *** deg; Left *** deg ? ?POSTURE:  ?*** ? ?PALPATION: ?*** ? ?LE ROM: ? ?Active ROM Right ?11/19/2021 Left ?11/19/2021  ?Hip flexion    ?Hip extension    ?Hip abduction    ?Hip adduction    ?Hip internal rotation    ?Hip  external rotation    ?Knee flexion    ?Knee extension    ?Ankle dorsiflexion    ?Ankle plantarflexion    ?Ankle inversion    ?Ankle eversion    ? (Blank rows = not tested) ? ?LE MMT: ? ?MMT Right ?11/19/2021 Left ?11/19/2021  ?Hip flexion    ?Hip extension    ?Hip abduction    ?Hip adduction    ?Hip internal rotation    ?Hip external rotation    ?Knee flexion    ?Knee extension    ?Ankle dorsiflexion    ?Ankle plantarflexion    ?Ankle inversion    ?Ankle eversion    ? (Blank rows = not tested) ? ?LOWER EXTREMITY SPECIAL TESTS:  ?{LEspecialtests:26242} ? ?FUNCTIONAL TESTS:  ?{Functional tests:24029} ? ?GAIT: ?Distance walked: *** ?Assistive device utilized: {Assistive devices:23999} ?Level of assistance: {Levels of assistance:24026} ?Comments: *** ? ? ? ?TODAY'S TREATMENT: ?*** ? ? ?PATIENT EDUCATION:  ?Education details: evaluation findings, POC, goals, HEP with proper form. ?Person educated: Patient ?Education method: Explanation, Verbal cues, and Handouts ?Education comprehension: verbalized understanding ? ? ?HOME EXERCISE PROGRAM: ?*** ? ?ASSESSMENT: ? ?CLINICAL IMPRESSION: ?Patient is a 36 y.o. F who was seen today for physical therapy evaluation and treatment for ***.  ? ? ?OBJECTIVE IMPAIRMENTS {opptimpairments:25111}.  ? ?ACTIVITY LIMITATIONS {activity limitations:25113}.  ? ?PERSONAL FACTORS {Personal factors:25162} are also affecting patient's functional outcome.  ? ? ?REHAB POTENTIAL: {rehabpotential:25112} ? ?CLINICAL DECISION MAKING: {clinical decision making:25114} ? ?EVALUATION COMPLEXITY: {Evaluation complexity:25115} ? ? ?GOALS: ?Goals reviewed with patient? Yes ? ?SHORT TERM GOALS: Target date: {follow up:25551} ? ?Pt to be IND with initial HEP for  therapeutic progression ?Baseline: ?Goal status: {GOALSTATUS:25110} ? ?2.  *** ?Baseline:  ?Goal status: {GOALSTATUS:25110} ? ?3.  *** ?Baseline:  ?Goal status: {GOALSTATUS:25110} ? ?4.  *** ?Baseline:  ?Goal status: {GOALSTATUS:25110} ? ?5.  *** ?Baseline:   ?Goal status: {GOALSTATUS:25110} ? ?6.  *** ?Baseline:  ?Goal status: {GOALSTATUS:25110} ? ?LONG TERM GOALS: Target date: {follow up:25551} ? ?*** ?Baseline:  ?Goal status: {GOALSTATUS:25110} ? ?2.  *** ?Baseline:  ?Goal status: {GOALSTATUS:25110} ? ?3.  *** ?Baseline:  ?Goal status: {GOALSTATUS:25110} ? ?4.  *** ?Baseline:  ?Goal status: {GOALSTATUS:25110} ? ?5.  *** ?Baseline:  ?Goal status: {GOALSTATUS:25110} ? ?6.  *** ?Baseline:  ?Goal status: {GOALSTATUS:25110} ? ? ?PLAN: ?PT FREQUENCY: 1-2x/week ? ?PT DURATION: {rehab duration:25117} ? ?PLANNED INTERVENTIONS: Therapeutic exercises, Therapeutic activity, Neuromuscular re-education, Balance training, Gait training, Patient/Family education, Joint manipulation, Joint mobilization, Stair training, Dry Needling, Cryotherapy, Moist heat, Taping, and Manual therapy ? ?PLAN FOR NEXT SESSION: Review/ update HEP PRN. *** ? ?Lulu Riding PT, DPT, LAT, ATC  ?11/19/21  7:34 AM ? ? ? ? ? ?

## 2021-11-26 ENCOUNTER — Ambulatory Visit: Payer: Medicaid Other | Admitting: Physical Therapy

## 2021-12-13 ENCOUNTER — Ambulatory Visit: Payer: Medicaid Other | Admitting: Physical Therapy

## 2021-12-16 ENCOUNTER — Ambulatory Visit: Payer: Medicaid Other | Attending: Family Medicine

## 2021-12-16 DIAGNOSIS — G8929 Other chronic pain: Secondary | ICD-10-CM | POA: Diagnosis not present

## 2021-12-16 DIAGNOSIS — R600 Localized edema: Secondary | ICD-10-CM | POA: Diagnosis not present

## 2021-12-16 DIAGNOSIS — M6281 Muscle weakness (generalized): Secondary | ICD-10-CM | POA: Insufficient documentation

## 2021-12-16 DIAGNOSIS — M25562 Pain in left knee: Secondary | ICD-10-CM | POA: Diagnosis not present

## 2021-12-16 DIAGNOSIS — M222X1 Patellofemoral disorders, right knee: Secondary | ICD-10-CM | POA: Insufficient documentation

## 2021-12-16 DIAGNOSIS — M25561 Pain in right knee: Secondary | ICD-10-CM | POA: Insufficient documentation

## 2021-12-16 NOTE — Therapy (Signed)
OUTPATIENT PHYSICAL THERAPY LOWER EXTREMITY EVALUATION   Patient Name: Joanna Ashley MRN: [id number] DOB:1986-02-15, 36 y.o., female Today's Date: 12/16/2021   PT End of Session - 12/16/21 1044     Visit Number 1    Number of Visits 8    Date for PT Re-Evaluation 01/13/22    Authorization Type [employer]    PT Start Time 1015    PT Stop Time 1045    PT Time Calculation (min) 30 min    Activity Tolerance Patient tolerated treatment well    Behavior During Therapy [facility] for tasks assessed/performed             Past Medical History:  Diagnosis Date   Anxiety    Chronic tension headaches 08/22/2019   History reviewed. No pertinent surgical history. Patient Active Problem List   Diagnosis Date Noted   Patellofemoral pain syndrome of right knee 11/08/2021   Contact dermatitis 01/02/2021   UTI (urinary tract infection) 01/02/2021   Bleeding from the nose 01/02/2021   Peripheral neuropathy 11/09/2020   Dysmenorrhea 04/26/2020   Attempting to conceive 04/26/2020   Bilateral knee pain 01/04/2020   Vitamin D deficiency 03/18/2019   COVID-19 virus detected 03/18/2019    PCP: Cora Collum, DO  REFERRING PROVIDER: Nestor Ramp, MD  REFERRING DIAG: M22.2X1 (ICD-10-CM) - Patellofemoral pain syndrome of right knee   THERAPY DIAG:  Chronic pain of left knee  Chronic pain of right knee  Muscle weakness (generalized)  Rationale for Evaluation and Treatment Rehabilitation  ONSET DATE: 11/08/2021   SUBJECTIVE:   SUBJECTIVE STATEMENT: Returns to PT for treatment of chronic knee pain R>L, no changes in medical status since last session  PERTINENT HISTORY: R > L but B knee pain. Right is bothering her most now and typically the right is worse.Pain is ongoing 3 years. Worse with stairs and standiong.  Anterior knee but also aches deep inside the joint.  Sometimes it bothers her at night.  Makes the whole leg feel uncomfortable.  No giving way although he occasionally feels slightly  unstable.  She recalls no specific incident to either knee.  She has had x-rays.   PAIN:  Are you having pain? Yes: NPRS scale: 6/10 Pain location: B knees Pain description: ache Aggravating factors: stairs, standing Relieving factors: rest meds ice  PRECAUTIONS: None  WEIGHT BEARING RESTRICTIONS No  FALLS:  Has patient fallen in last 6 months? No  LIVING ENVIRONMENT: Lives with: lives with their family Lives in: House/apartment Stairs:  yes Has following equipment at home: None  OCCUPATION: factory work, Theatre stage manager  PLOF: Independent  PATIENT GOALS: To reduce and manage my knee pain   OBJECTIVE:   DIAGNOSTIC FINDINGS: 4 view complete knee of both right and left done on 530 12022: I reviewed the images. I agree with the findings of some moderate patellofemoral degenerative changes on the right knee.  She also has slightly laterally slanted patella more notable on the right.  Mild joint space narrowing laterallySeen on the right as well.  PATIENT SURVEYS:  Not tested due to language   COGNITION:  Overall cognitive status: Within functional limits for tasks assessed     SENSATION: Not tested    MUSCLE LENGTH: Hamstrings: Right 70 deg; Left 70 deg   POSTURE: No Significant postural limitations  PALPATION: Good patellar mobility  LOWER EXTREMITY ROM: WFL throughout  Active ROM Right eval Left eval  Hip flexion    Hip extension    Hip abduction  Hip adduction    Hip internal rotation    Hip external rotation    Knee flexion    Knee extension    Ankle dorsiflexion    Ankle plantarflexion    Ankle inversion    Ankle eversion     (Blank rows = not tested)  LOWER EXTREMITY MMT:  MMT Right eval Left eval  Hip flexion 4 4  Hip extension 4 4  Hip abduction 4 4  Hip adduction    Hip internal rotation    Hip external rotation    Knee flexion 4- 4-  Knee extension 4- 4-  Ankle dorsiflexion    Ankle plantarflexion    Ankle inversion     Ankle eversion     (Blank rows = not tested)  LOWER EXTREMITY SPECIAL TESTS:  Hip special tests: ITB WFL Knee special tests: Patellafemoral apprehension test: positive  and Patellafemoral grind test: positive   FUNCTIONAL TESTS:  5 times sit to stand: 20s  GAIT: Distance walked: 42ft x2 Assistive device utilized: None Level of assistance: Complete Independence    TODAY'S TREATMENT: Eval    PATIENT EDUCATION:  Education details: Discussed eval findings, rehab rationale and POC and patient is in agreement  Person educated: Patient Education method: Explanation Education comprehension: verbalized understanding and needs further education   HOME EXERCISE PROGRAM: TBD  ASSESSMENT:  CLINICAL IMPRESSION: Patient is a 36 y.o. female who was seen today for physical therapy evaluation and treatment for B knee pain R>L.  Weakness noted in B quads as well as positive signs of patellar crepitus and lateral tracking   OBJECTIVE IMPAIRMENTS decreased activity tolerance, decreased endurance, decreased knowledge of condition, decreased strength, and pain.   ACTIVITY LIMITATIONS standing, squatting, and stairs  PARTICIPATION LIMITATIONS: community activity and occupation  PERSONAL FACTORS Fitness, Past/current experiences, and language barrier  are also affecting patient's functional outcome.   REHAB POTENTIAL: Good  CLINICAL DECISION MAKING: Stable/uncomplicated  EVALUATION COMPLEXITY: Low   GOALS: Goals reviewed with patient? Yes  SHORT TERM GOALS: Target date: 12/30/2021   Patient to demonstrate independence in HEP  Baseline: Goal status: INITIAL  LONG TERM GOALS: Target date: 01/13/2022   Decrease pain to 4/10 Baseline: 8/10 Goal status: INITIAL  2.  4/5 B knee strength Baseline: 4-/5  Goal status: INITIAL  3.  Decreased 5x STS time to 15s Baseline: 20s Goal status: INITIAL   PLAN: PT FREQUENCY: 2x/week  PT DURATION: 4 weeks  PLANNED INTERVENTIONS:  Therapeutic exercises, Therapeutic activity, Neuromuscular re-education, Balance training, Gait training, Patient/Family education, Joint mobilization, Stair training, and Taping  PLAN FOR NEXT SESSION: HEP update, aerobic training, PFPS protocol, VMO strengthening   Hildred Laser, PT 12/16/2021, 11:35 AM   Check all possible CPT codes: 74128 - Re-evaluation, 97110- Therapeutic Exercise, [phone]- Neuro Re-education, [phone] - Gait Training, [phone] - Manual Therapy, 97530 - Therapeutic Activities, and 97535 - Self Care     If treatment provided at initial evaluation, no treatment charged due to lack of authorization.

## 2021-12-20 ENCOUNTER — Ambulatory Visit: Payer: Medicaid Other | Admitting: Family Medicine

## 2021-12-24 NOTE — Therapy (Signed)
OUTPATIENT PHYSICAL THERAPY TREATMENT NOTE   Patient Name: Joanna Ashley MRN: [id number] DOB:[birth year], 36 y.o., female Today's Date: 12/25/2021  PCP: Cora Collum, DO REFERRING PROVIDER: Nestor Ramp, MD  END OF SESSION:   PT End of Session - 12/25/21 1308     Visit Number 2    Number of Visits 8    Date for PT Re-Evaluation 01/13/22    Authorization Type [place] MCD    PT Start Time 1308   Pt arrived 8 mins late to appt   PT Stop Time 1340    PT Time Calculation (min) 32 min    Activity Tolerance Patient tolerated treatment well    Behavior During Therapy [facility] for tasks assessed/performed             Past Medical History:  Diagnosis Date   Anxiety    Chronic tension headaches 08/22/2019   History reviewed. No pertinent surgical history. Patient Active Problem List   Diagnosis Date Noted   Patellofemoral pain syndrome of right knee 11/08/2021   Contact dermatitis 01/02/2021   UTI (urinary tract infection) 01/02/2021   Bleeding from the nose 01/02/2021   Peripheral neuropathy 11/09/2020   Dysmenorrhea 04/26/2020   Attempting to conceive 04/26/2020   Bilateral knee pain 01/04/2020   Vitamin D deficiency 03/18/2019   COVID-19 virus detected 03/18/2019    REFERRING DIAG: M22.2X1 (ICD-10-CM) - Patellofemoral pain syndrome of right knee   THERAPY DIAG:  Chronic pain of left knee  Chronic pain of right knee  Muscle weakness (generalized)  Localized edema  Rationale for Evaluation and Treatment Rehabilitation  PERTINENT HISTORY: R > L but B knee pain. Right is bothering her most now and typically the right is worse.Pain is ongoing 3 years. Worse with stairs and standiong.  Anterior knee but also aches deep inside the joint.  Sometimes it bothers her at night.  Makes the whole leg feel uncomfortable.  No giving way although he occasionally feels slightly unstable.  She recalls no specific incident to either knee.  She has had x-rays.   PRECAUTIONS: None  ONSET  DATE: 11/08/2021   SUBJECTIVE: Patient's son interpreted throughout session. She states her R knee is hurting more than her left.   PAIN:  Are you having pain? Yes: NPRS scale: 6/10 Pain location: B knees (R>L) Pain description: ache Aggravating factors: stairs, standing Relieving factors: rest meds ice   OBJECTIVE: (objective measures completed at initial evaluation unless otherwise dated)   DIAGNOSTIC FINDINGS: 4 view complete knee of both right and left done on 530 12022: I reviewed the images. I agree with the findings of some moderate patellofemoral degenerative changes on the right knee.  She also has slightly laterally slanted patella more notable on the right.  Mild joint space narrowing laterallySeen on the right as well.   PATIENT SURVEYS:  Not tested due to language    COGNITION:           Overall cognitive status: Within functional limits for tasks assessed                          SENSATION: Not tested       MUSCLE LENGTH: Hamstrings: Right 70 deg; Left 70 deg     POSTURE: No Significant postural limitations   PALPATION: Good patellar mobility   LOWER EXTREMITY ROM: WFL throughout   Active ROM Right eval Left eval  Hip flexion      Hip extension  Hip abduction      Hip adduction      Hip internal rotation      Hip external rotation      Knee flexion      Knee extension      Ankle dorsiflexion      Ankle plantarflexion      Ankle inversion      Ankle eversion       (Blank rows = not tested)   LOWER EXTREMITY MMT:   MMT Right eval Left eval  Hip flexion 4 4  Hip extension 4 4  Hip abduction 4 4  Hip adduction      Hip internal rotation      Hip external rotation      Knee flexion 4- 4-  Knee extension 4- 4-  Ankle dorsiflexion      Ankle plantarflexion      Ankle inversion      Ankle eversion       (Blank rows = not tested)   LOWER EXTREMITY SPECIAL TESTS:  Hip special tests: ITB WFL Knee special tests: Patellafemoral  apprehension test: positive  and Patellafemoral grind test: positive    FUNCTIONAL TESTS:  5 times sit to stand: 20s   GAIT: Distance walked: 53ft x2 Assistive device utilized: None Level of assistance: Complete Independence       TODAY'S TREATMENT: [facility] Adult PT Treatment:                                                DATE: 12/25/2021 Therapeutic Exercise: Nustep level 5 x 5 mins Standing marching/abd/ext x10 each BIL Standing heel/toe raises 2x10 Slant board stretch x45" LAQ 3x10 BIL Seated hamstring curls GTB x10 BIL Seated marching 2x10 BIL Supine clamshells GTB 2x10 SLR x10 BIL   12/16/2021: Eval      PATIENT EDUCATION:  Education details: Discussed eval findings, rehab rationale and POC and patient is in agreement  Person educated: Patient Education method: Explanation Education comprehension: verbalized understanding and needs further education     HOME EXERCISE PROGRAM: Access Code: [email or web address] URL: [email or web address] Date: 12/25/2021 Prepared by: Evelene Croon  Exercises - Seated Long Arc Quad  - 1 x daily - 7 x weekly - 3 sets - 10 reps - Seated March  - 1 x daily - 7 x weekly - 3 sets - 10 reps - Supine Active Straight Leg Raise  - 1 x daily - 7 x weekly - 3 sets - 10 reps - Heel Toe Raises with Counter Support  - 1 x daily - 7 x weekly - 3 sets - 10 reps    ASSESSMENT:   CLINICAL IMPRESSION: Patient arrived 8 minutes late to appointment, delaying treatment time. Patient's son interpreted throughout session as patient did not want a video interpreter. Session today focused on BIL LE strengthening. Created HEP and patient demonstrated understanding of each exercise. At the end of the session, she mentioned that she is interested in taping techniques for her knee pain. Patient was able to tolerate all prescribed exercises with no adverse effects. Patient continues to benefit from skilled PT services and should be progressed as able to  improve functional independence.      OBJECTIVE IMPAIRMENTS decreased activity tolerance, decreased endurance, decreased knowledge of condition, decreased strength, and pain.    ACTIVITY LIMITATIONS standing, squatting, and stairs   PARTICIPATION  LIMITATIONS: community activity and occupation   [place], Past/current experiences, and language barrier  are also affecting patient's functional outcome.    REHAB POTENTIAL: Good   CLINICAL DECISION MAKING: Stable/uncomplicated   EVALUATION COMPLEXITY: Low     GOALS: Goals reviewed with patient? Yes   SHORT TERM GOALS: Target date: 12/30/2021    Patient to demonstrate independence in HEP  Baseline: Goal status: INITIAL   LONG TERM GOALS: Target date: 01/13/2022    Decrease pain to 4/10 Baseline: 8/10 Goal status: INITIAL   2.  4/5 B knee strength Baseline: 4-/5  Goal status: INITIAL   3.  Decreased 5x STS time to 15s Baseline: 20s Goal status: INITIAL     PLAN: PT FREQUENCY: 2x/week   PT DURATION: 4 weeks   PLANNED INTERVENTIONS: Therapeutic exercises, Therapeutic activity, Neuromuscular re-education, Balance training, Gait training, Patient/Family education, Joint mobilization, Stair training, and Taping   PLAN FOR NEXT SESSION: HEP update, aerobic training, PFPS protocol, VMO strengthening, taping for knee next session    Evelene Croon, PTA 12/25/2021, 1:43 PM

## 2021-12-25 ENCOUNTER — Ambulatory Visit: Payer: Medicaid Other

## 2021-12-25 DIAGNOSIS — R600 Localized edema: Secondary | ICD-10-CM | POA: Diagnosis not present

## 2021-12-25 DIAGNOSIS — M6281 Muscle weakness (generalized): Secondary | ICD-10-CM

## 2021-12-25 DIAGNOSIS — G8929 Other chronic pain: Secondary | ICD-10-CM | POA: Diagnosis not present

## 2021-12-25 DIAGNOSIS — M25562 Pain in left knee: Secondary | ICD-10-CM

## 2021-12-25 DIAGNOSIS — M25561 Pain in right knee: Secondary | ICD-10-CM | POA: Diagnosis not present

## 2021-12-25 DIAGNOSIS — M222X1 Patellofemoral disorders, right knee: Secondary | ICD-10-CM | POA: Diagnosis not present

## 2021-12-26 ENCOUNTER — Ambulatory Visit: Payer: Medicaid Other

## 2021-12-26 DIAGNOSIS — R600 Localized edema: Secondary | ICD-10-CM | POA: Diagnosis not present

## 2021-12-26 DIAGNOSIS — M6281 Muscle weakness (generalized): Secondary | ICD-10-CM | POA: Diagnosis not present

## 2021-12-26 DIAGNOSIS — G8929 Other chronic pain: Secondary | ICD-10-CM

## 2021-12-26 DIAGNOSIS — M25562 Pain in left knee: Secondary | ICD-10-CM | POA: Diagnosis not present

## 2021-12-26 DIAGNOSIS — M222X1 Patellofemoral disorders, right knee: Secondary | ICD-10-CM | POA: Diagnosis not present

## 2021-12-26 DIAGNOSIS — M25561 Pain in right knee: Secondary | ICD-10-CM | POA: Diagnosis not present

## 2021-12-26 NOTE — Therapy (Signed)
OUTPATIENT PHYSICAL THERAPY TREATMENT NOTE   Patient Name: Harbour Nordmeyer MRN: [id number] DOB:[birth year], 36 y.o., female Today's Date: 12/26/2021  PCP: Cora Collum, DO REFERRING PROVIDER: Nestor Ramp, MD  END OF SESSION:   PT End of Session - 12/26/21 1409     Visit Number 3    Number of Visits 8    Date for PT Re-Evaluation 01/13/22    Authorization Type [place] MCD    PT Start Time 1410    PT Stop Time 1440    PT Time Calculation (min) 30 min    Activity Tolerance Patient tolerated treatment well    Behavior During Therapy [facility] for tasks assessed/performed              Past Medical History:  Diagnosis Date   Anxiety    Chronic tension headaches 08/22/2019   History reviewed. No pertinent surgical history. Patient Active Problem List   Diagnosis Date Noted   Patellofemoral pain syndrome of right knee 11/08/2021   Contact dermatitis 01/02/2021   UTI (urinary tract infection) 01/02/2021   Bleeding from the nose 01/02/2021   Peripheral neuropathy 11/09/2020   Dysmenorrhea 04/26/2020   Attempting to conceive 04/26/2020   Bilateral knee pain 01/04/2020   Vitamin D deficiency 03/18/2019   COVID-19 virus detected 03/18/2019    REFERRING DIAG: M22.2X1 (ICD-10-CM) - Patellofemoral pain syndrome of right knee   THERAPY DIAG:  Chronic pain of left knee  Chronic pain of right knee  Muscle weakness (generalized)  Rationale for Evaluation and Treatment Rehabilitation  PERTINENT HISTORY: R > L but B knee pain. Right is bothering her most now and typically the right is worse.Pain is ongoing 3 years. Worse with stairs and standiong.  Anterior knee but also aches deep inside the joint.  Sometimes it bothers her at night.  Makes the whole leg feel uncomfortable.  No giving way although he occasionally feels slightly unstable.  She recalls no specific incident to either knee.  She has had x-rays.   PRECAUTIONS: None  ONSET DATE: 11/08/2021   SUBJECTIVE: Patient's son  interpreted throughout session. Requested to tape knees prior to session.  No change in knee symptoms  PAIN:  Are you having pain? Yes: NPRS scale: 6/10 Pain location: B knees (R>L) Pain description: ache Aggravating factors: stairs, standing Relieving factors: rest meds ice   OBJECTIVE: (objective measures completed at initial evaluation unless otherwise dated)   DIAGNOSTIC FINDINGS: 4 view complete knee of both right and left done on 530 12022: I reviewed the images. I agree with the findings of some moderate patellofemoral degenerative changes on the right knee.  She also has slightly laterally slanted patella more notable on the right.  Mild joint space narrowing laterallySeen on the right as well.   PATIENT SURVEYS:  Not tested due to language    COGNITION:           Overall cognitive status: Within functional limits for tasks assessed                          SENSATION: Not tested       MUSCLE LENGTH: Hamstrings: Right 70 deg; Left 70 deg     POSTURE: No Significant postural limitations   PALPATION: Good patellar mobility   LOWER EXTREMITY ROM: WFL throughout   Active ROM Right eval Left eval  Hip flexion      Hip extension      Hip abduction  Hip adduction      Hip internal rotation      Hip external rotation      Knee flexion      Knee extension      Ankle dorsiflexion      Ankle plantarflexion      Ankle inversion      Ankle eversion       (Blank rows = not tested)   LOWER EXTREMITY MMT:   MMT Right eval Left eval  Hip flexion 4 4  Hip extension 4 4  Hip abduction 4 4  Hip adduction      Hip internal rotation      Hip external rotation      Knee flexion 4- 4-  Knee extension 4- 4-  Ankle dorsiflexion      Ankle plantarflexion      Ankle inversion      Ankle eversion       (Blank rows = not tested)   LOWER EXTREMITY SPECIAL TESTS:  Hip special tests: ITB WFL Knee special tests: Patellafemoral apprehension test: positive  and  Patellafemoral grind test: positive    FUNCTIONAL TESTS:  5 times sit to stand: 20s   GAIT: Distance walked: 69ft x2 Assistive device utilized: None Level of assistance: Complete Independence       TODAY'S TREATMENT: [facility] Adult PT Treatment:                                                DATE: 12/26/21 Therapeutic Exercise: Quad set R 15x SLR R 15x SAQs 15x R abduction in sidelie 15x R clamshell 15x FAQs with adduction 15x Bridge 15x PF against wall 15x  Self Care: McConnell medial glide [facility] Adult PT Treatment:                                                DATE: 12/25/2021 Therapeutic Exercise: Nustep level 5 x 5 mins Standing marching/abd/ext x10 each BIL Standing heel/toe raises 2x10 Slant board stretch x45" LAQ 3x10 BIL Seated hamstring curls GTB x10 BIL Seated marching 2x10 BIL Supine clamshells GTB 2x10 SLR x10 BIL   12/16/2021: Eval      PATIENT EDUCATION:  Education details: Discussed eval findings, rehab rationale and POC and patient is in agreement  Person educated: Patient Education method: Explanation Education comprehension: verbalized understanding and needs further education     HOME EXERCISE PROGRAM: Access Code: [email or web address] URL: [email or web address] Date: 12/25/2021 Prepared by: Harland German  Exercises - Seated Long Arc Quad  - 1 x daily - 7 x weekly - 3 sets - 10 reps - Seated March  - 1 x daily - 7 x weekly - 3 sets - 10 reps - Supine Active Straight Leg Raise  - 1 x daily - 7 x weekly - 3 sets - 10 reps - Heel Toe Raises with Counter Support  - 1 x daily - 7 x weekly - 3 sets - 10 reps    ASSESSMENT:   CLINICAL IMPRESSION: Too early to expect changes in symptoms.  Today's session focused on HEP review and update as well as ensuring patient able to perform and isolate quad contraction.  After verbal and tactile cuing, patient able to preform a strong  contraction.  Discussed need to perform HEP and be able to generate a  strong contraction to defer the patella from gliding laterally and producing her symptoms.       OBJECTIVE IMPAIRMENTS decreased activity tolerance, decreased endurance, decreased knowledge of condition, decreased strength, and pain.    ACTIVITY LIMITATIONS standing, squatting, and stairs   PARTICIPATION LIMITATIONS: community activity and occupation   PERSONAL FACTORS Fitness, Past/current experiences, and language barrier  are also affecting patient's functional outcome.    REHAB POTENTIAL: Good   CLINICAL DECISION MAKING: Stable/uncomplicated   EVALUATION COMPLEXITY: Low     GOALS: Goals reviewed with patient? Yes   SHORT TERM GOALS: Target date: 12/30/2021    Patient to demonstrate independence in HEP  Baseline: Goal status: INITIAL   LONG TERM GOALS: Target date: 01/13/2022    Decrease pain to 4/10 Baseline: 8/10 Goal status: INITIAL   2.  4/5 B knee strength Baseline: 4-/5  Goal status: INITIAL   3.  Decreased 5x STS time to 15s Baseline: 20s Goal status: INITIAL     PLAN: PT FREQUENCY: 2x/week   PT DURATION: 4 weeks   PLANNED INTERVENTIONS: Therapeutic exercises, Therapeutic activity, Neuromuscular re-education, Balance training, Gait training, Patient/Family education, Joint mobilization, Stair training, and Taping   PLAN FOR NEXT SESSION: HEP update, aerobic training, PFPS protocol, VMO strengthening, taping for knee next session, qua set review    Hildred Laser, PT 12/26/2021, 3:54 PM

## 2021-12-30 ENCOUNTER — Encounter: Payer: Self-pay | Admitting: Family Medicine

## 2021-12-30 ENCOUNTER — Ambulatory Visit: Payer: Medicaid Other | Admitting: Family Medicine

## 2021-12-30 ENCOUNTER — Ambulatory Visit: Payer: Medicaid Other

## 2021-12-30 VITALS — BP 102/62 | HR 74 | Ht 62.5 in | Wt 203.0 lb

## 2021-12-30 DIAGNOSIS — R350 Frequency of micturition: Secondary | ICD-10-CM

## 2021-12-30 DIAGNOSIS — Z789 Other specified health status: Secondary | ICD-10-CM

## 2021-12-30 LAB — POCT URINALYSIS DIP (MANUAL ENTRY)
Bilirubin, UA: NEGATIVE
Blood, UA: NEGATIVE
Glucose, UA: NEGATIVE mg/dL
Ketones, POC UA: NEGATIVE mg/dL
Leukocytes, UA: NEGATIVE
Nitrite, UA: NEGATIVE
Protein Ur, POC: NEGATIVE mg/dL
Spec Grav, UA: 1.025 (ref 1.010–1.025)
Urobilinogen, UA: 0.2 E.U./dL
pH, UA: 5 (ref 5.0–8.0)

## 2021-12-30 LAB — POCT URINE PREGNANCY: Preg Test, Ur: NEGATIVE

## 2021-12-30 MED ORDER — PREPLUS 27-1 MG PO TABS: 1.0000 | ORAL_TABLET | Freq: Every day | ORAL | 13 refills | Status: DC

## 2021-12-30 NOTE — Assessment & Plan Note (Signed)
Denies dysuria, hesitancy. Wants to check for UTI. Will also obtain Upreg

## 2021-12-30 NOTE — Therapy (Deleted)
OUTPATIENT PHYSICAL THERAPY TREATMENT NOTE   Patient Name: Joanna Ashley MRN: [id number] DOB:[birth year], 36 y.o., female Today's Date: 12/30/2021  PCP: Cora Collum, DO REFERRING PROVIDER: Nestor Ramp, MD  END OF SESSION:      Past Medical History:  Diagnosis Date   Anxiety    Chronic tension headaches 08/22/2019   No past surgical history on file. Patient Active Problem List   Diagnosis Date Noted   Urinary frequency 12/30/2021   Patellofemoral pain syndrome of right knee 11/08/2021   Contact dermatitis 01/02/2021   UTI (urinary tract infection) 01/02/2021   Bleeding from the nose 01/02/2021   Peripheral neuropathy 11/09/2020   Dysmenorrhea 04/26/2020   Attempting to conceive 04/26/2020   Bilateral knee pain 01/04/2020   Vitamin D deficiency 03/18/2019   COVID-19 virus detected 03/18/2019    REFERRING DIAG: M22.2X1 (ICD-10-CM) - Patellofemoral pain syndrome of right knee   THERAPY DIAG:  No diagnosis found.  Rationale for Evaluation and Treatment Rehabilitation  PERTINENT HISTORY: R > L but B knee pain. Right is bothering her most now and typically the right is worse.Pain is ongoing 3 years. Worse with stairs and standiong.  Anterior knee but also aches deep inside the joint.  Sometimes it bothers her at night.  Makes the whole leg feel uncomfortable.  No giving way although he occasionally feels slightly unstable.  She recalls no specific incident to either knee.  She has had x-rays.   PRECAUTIONS: None  ONSET DATE: 11/08/2021   SUBJECTIVE: Patient's son interpreted throughout session. Requested to tape knees prior to session.  No change in knee symptoms  PAIN:  Are you having pain? Yes: NPRS scale: 6/10 Pain location: B knees (R>L) Pain description: ache Aggravating factors: stairs, standing Relieving factors: rest meds ice   OBJECTIVE: (objective measures completed at initial evaluation unless otherwise dated)   DIAGNOSTIC FINDINGS: 4 view complete  knee of both right and left done on 530 12022: I reviewed the images. I agree with the findings of some moderate patellofemoral degenerative changes on the right knee.  She also has slightly laterally slanted patella more notable on the right.  Mild joint space narrowing laterallySeen on the right as well.   PATIENT SURVEYS:  Not tested due to language    COGNITION:           Overall cognitive status: Within functional limits for tasks assessed                          SENSATION: Not tested       MUSCLE LENGTH: Hamstrings: Right 70 deg; Left 70 deg     POSTURE: No Significant postural limitations   PALPATION: Good patellar mobility   LOWER EXTREMITY ROM: WFL throughout   Active ROM Right eval Left eval  Hip flexion      Hip extension      Hip abduction      Hip adduction      Hip internal rotation      Hip external rotation      Knee flexion      Knee extension      Ankle dorsiflexion      Ankle plantarflexion      Ankle inversion      Ankle eversion       (Blank rows = not tested)   LOWER EXTREMITY MMT:   MMT Right eval Left eval  Hip flexion 4 4  Hip extension 4 4  Hip abduction 4 4  Hip adduction      Hip internal rotation      Hip external rotation      Knee flexion 4- 4-  Knee extension 4- 4-  Ankle dorsiflexion      Ankle plantarflexion      Ankle inversion      Ankle eversion       (Blank rows = not tested)   LOWER EXTREMITY SPECIAL TESTS:  Hip special tests: ITB WFL Knee special tests: Patellafemoral apprehension test: positive  and Patellafemoral grind test: positive    FUNCTIONAL TESTS:  5 times sit to stand: 20s   GAIT: Distance walked: 71ft x2 Assistive device utilized: None Level of assistance: Complete Independence       TODAY'S TREATMENT: [facility] Adult PT Treatment:                                                DATE: 12/26/21 Therapeutic Exercise: Quad set R 15x SLR R 15x SAQs 15x R abduction in sidelie 15x R clamshell  15x FAQs with adduction 15x Bridge 15x PF against wall 15x  Self Care: McConnell medial glide [facility] Adult PT Treatment:                                                DATE: 12/25/2021 Therapeutic Exercise: Nustep level 5 x 5 mins Standing marching/abd/ext x10 each BIL Standing heel/toe raises 2x10 Slant board stretch x45" LAQ 3x10 BIL Seated hamstring curls GTB x10 BIL Seated marching 2x10 BIL Supine clamshells GTB 2x10 SLR x10 BIL   12/16/2021: Eval      PATIENT EDUCATION:  Education details: Discussed eval findings, rehab rationale and POC and patient is in agreement  Person educated: Patient Education method: Explanation Education comprehension: verbalized understanding and needs further education     HOME EXERCISE PROGRAM: Access Code: [email or web address] URL: [email or web address] Date: 12/25/2021 Prepared by: Harland German  Exercises - Seated Long Arc Quad  - 1 x daily - 7 x weekly - 3 sets - 10 reps - Seated March  - 1 x daily - 7 x weekly - 3 sets - 10 reps - Supine Active Straight Leg Raise  - 1 x daily - 7 x weekly - 3 sets - 10 reps - Heel Toe Raises with Counter Support  - 1 x daily - 7 x weekly - 3 sets - 10 reps    ASSESSMENT:   CLINICAL IMPRESSION: Too early to expect changes in symptoms.  Today's session focused on HEP review and update as well as ensuring patient able to perform and isolate quad contraction.  After verbal and tactile cuing, patient able to preform a strong contraction.  Discussed need to perform HEP and be able to generate a strong contraction to defer the patella from gliding laterally and producing her symptoms.       OBJECTIVE IMPAIRMENTS decreased activity tolerance, decreased endurance, decreased knowledge of condition, decreased strength, and pain.    ACTIVITY LIMITATIONS standing, squatting, and stairs   PARTICIPATION LIMITATIONS: community activity and occupation   PERSONAL FACTORS Fitness, Past/current experiences,  and language barrier  are also affecting patient's functional outcome.    REHAB POTENTIAL: Good   CLINICAL DECISION MAKING: Stable/uncomplicated  EVALUATION COMPLEXITY: Low     GOALS: Goals reviewed with patient? Yes   SHORT TERM GOALS: Target date: 12/30/2021    Patient to demonstrate independence in HEP  Baseline: Goal status: INITIAL   LONG TERM GOALS: Target date: 01/13/2022    Decrease pain to 4/10 Baseline: 8/10 Goal status: INITIAL   2.  4/5 B knee strength Baseline: 4-/5  Goal status: INITIAL   3.  Decreased 5x STS time to 15s Baseline: 20s Goal status: INITIAL     PLAN: PT FREQUENCY: 2x/week   PT DURATION: 4 weeks   PLANNED INTERVENTIONS: Therapeutic exercises, Therapeutic activity, Neuromuscular re-education, Balance training, Gait training, Patient/Family education, Joint mobilization, Stair training, and Taping   PLAN FOR NEXT SESSION: HEP update, aerobic training, PFPS protocol, VMO strengthening, taping for knee, quad set review    Hildred Laser, PT 12/30/2021, 2:04 PM

## 2021-12-30 NOTE — Patient Instructions (Addendum)
It was great seeing you today!  We checked your urine for infection. If it is positive I will give you a call. If negative I will send a message.   It will be important to get back in to see the OBGYN specialist for follow up on this.   I have also resent your prenatal vitamins.   Feel free to call with any questions or concerns at any time, at [phone].   Take care,  Dr. Cora Collum [facility]

## 2021-12-30 NOTE — Progress Notes (Signed)
    SUBJECTIVE:   CHIEF COMPLAINT / HPI:   Patient presents to discuss irregular menstrual cycles. [place] IPAD Interpreter [id number]   Patient presents for abnormal cycles, she states her cycles occur on different days every month.  States she has been trying to get pregnant for 2 years. Has been happening for over a year, was previously seen by OB/GYN who discussed timing intercourse during her ovulations, and provided reassurance.  Pap 1 year ago normal. LMP 5/25-528. Sometimes last 3 days sometimes 7 days. Flow sometimes light, and sometimes heavier. Denies menstrual cramping, but breasts get tender before cycles. Does not taking anything for the pain as it goes away during her cycle.  She also requests testing for UTI. Denies dysuria or hesitancy but does feel she urinates frequently.  Her friends have told her that UTIs can prevent pregnancy so she wants to check on that.    PERTINENT  PMH / PSH: Reviewed   OBJECTIVE:   BP 102/62   Pulse 74   Ht 5' 2.5" (1.588 m)   Wt 203 lb (92.1 kg)   LMP 12/08/2021   SpO2 97%   BMI 36.54 kg/m    Physical exam General: well appearing, NAD Cardiovascular: RRR, no murmurs Lungs: CTAB. Normal WOB Abdomen: soft, non-distended, non-tender Skin: warm, dry. No edema   ASSESSMENT/PLAN:   Attempting to conceive LMP  5/25-528. Cycles occur on different days each month. Denies cramping. Trying to conceive for past 2 years. No hx of gyn cancer. Previously seen by OBGYN who provided reassurance. Recommended following up with them again in case further workup is warranted. Prescribed prenatal vitamins since she was unable to pick them up from pharmacy previously.   Urinary frequency Denies dysuria, hesitancy. Wants to check for UTI. Will also obtain Upreg     Cora Collum, DO [facility]

## 2021-12-30 NOTE — Assessment & Plan Note (Signed)
LMP  5/25-528. Cycles occur on different days each month. Denies cramping. Trying to conceive for past 2 years. No hx of gyn cancer. Previously seen by OBGYN who provided reassurance. Recommended following up with them again in case further workup is warranted. Prescribed prenatal vitamins since she was unable to pick them up from pharmacy previously.

## 2021-12-31 ENCOUNTER — Ambulatory Visit: Payer: Medicaid Other

## 2021-12-31 DIAGNOSIS — G8929 Other chronic pain: Secondary | ICD-10-CM | POA: Diagnosis not present

## 2021-12-31 DIAGNOSIS — M222X1 Patellofemoral disorders, right knee: Secondary | ICD-10-CM | POA: Diagnosis not present

## 2021-12-31 DIAGNOSIS — R600 Localized edema: Secondary | ICD-10-CM

## 2021-12-31 DIAGNOSIS — M25562 Pain in left knee: Secondary | ICD-10-CM | POA: Diagnosis not present

## 2021-12-31 DIAGNOSIS — M6281 Muscle weakness (generalized): Secondary | ICD-10-CM | POA: Diagnosis not present

## 2021-12-31 DIAGNOSIS — M25561 Pain in right knee: Secondary | ICD-10-CM | POA: Diagnosis not present

## 2021-12-31 NOTE — Therapy (Signed)
OUTPATIENT PHYSICAL THERAPY TREATMENT NOTE   Patient Name: Joanna Ashley MRN: [id number] DOB:Mar 22, 1986, 36 y.o., female Today's Date: 12/31/2021  PCP: Cora Collum, DO REFERRING PROVIDER: Nestor Ramp, MD  END OF SESSION:   PT End of Session - 12/31/21 1215     Visit Number 4    Number of Visits 8    Date for PT Re-Evaluation 01/13/22    Authorization Type [place] [employer]    PT Start Time 1215    PT Stop Time 1255    PT Time Calculation (min) 40 min    Activity Tolerance Patient tolerated treatment well    Behavior During Therapy [facility] for tasks assessed/performed               Past Medical History:  Diagnosis Date   Anxiety    Chronic tension headaches 08/22/2019   History reviewed. No pertinent surgical history. Patient Active Problem List   Diagnosis Date Noted   Urinary frequency 12/30/2021   Patellofemoral pain syndrome of right knee 11/08/2021   Contact dermatitis 01/02/2021   UTI (urinary tract infection) 01/02/2021   Bleeding from the nose 01/02/2021   Peripheral neuropathy 11/09/2020   Dysmenorrhea 04/26/2020   Attempting to conceive 04/26/2020   Bilateral knee pain 01/04/2020   Vitamin D deficiency 03/18/2019   COVID-19 virus detected 03/18/2019    REFERRING DIAG: M22.2X1 (ICD-10-CM) - Patellofemoral pain syndrome of right knee   THERAPY DIAG:  Chronic pain of left knee  Chronic pain of right knee  Muscle weakness (generalized)  Localized edema  Rationale for Evaluation and Treatment Rehabilitation  PERTINENT HISTORY: R > L but B knee pain. Right is bothering her most now and typically the right is worse.Pain is ongoing 3 years. Worse with stairs and standiong.  Anterior knee but also aches deep inside the joint.  Sometimes it bothers her at night.  Makes the whole leg feel uncomfortable.  No giving way although he occasionally feels slightly unstable.  She recalls no specific incident to either knee.  She has had x-rays.   PRECAUTIONS: None  ONSET  DATE: 11/08/2021   SUBJECTIVE: Patient's son interpreted throughout session. Reporting less knee pain today, taping was helpful, but noted some skin irritation, did not wear past 24 hours. Rainy weather does not bother knees  PAIN:  Are you having pain? Yes: NPRS scale: 6/10 Pain location: B knees (R>L) Pain description: ache Aggravating factors: stairs, standing Relieving factors: rest meds ice   OBJECTIVE: (objective measures completed at initial evaluation unless otherwise dated)   DIAGNOSTIC FINDINGS: 4 view complete knee of both right and left done on 530 12022: I reviewed the images. I agree with the findings of some moderate patellofemoral degenerative changes on the right knee.  She also has slightly laterally slanted patella more notable on the right.  Mild joint space narrowing laterallySeen on the right as well.   PATIENT SURVEYS:  Not tested due to language    COGNITION:           Overall cognitive status: Within functional limits for tasks assessed                          SENSATION: Not tested       MUSCLE LENGTH: Hamstrings: Right 70 deg; Left 70 deg     POSTURE: No Significant postural limitations   PALPATION: Good patellar mobility   LOWER EXTREMITY ROM: WFL throughout   Active ROM Right eval Left eval  Hip flexion      Hip extension      Hip abduction      Hip adduction      Hip internal rotation      Hip external rotation      Knee flexion      Knee extension      Ankle dorsiflexion      Ankle plantarflexion      Ankle inversion      Ankle eversion       (Blank rows = not tested)   LOWER EXTREMITY MMT:   MMT Right eval Left eval  Hip flexion 4 4  Hip extension 4 4  Hip abduction 4 4  Hip adduction      Hip internal rotation      Hip external rotation      Knee flexion 4- 4-  Knee extension 4- 4-  Ankle dorsiflexion      Ankle plantarflexion      Ankle inversion      Ankle eversion       (Blank rows = not tested)   LOWER  EXTREMITY SPECIAL TESTS:  Hip special tests: ITB WFL Knee special tests: Patellafemoral apprehension test: positive  and Patellafemoral grind test: positive    FUNCTIONAL TESTS:  5 times sit to stand: 20s   GAIT: Distance walked: 46ft x2 Assistive device utilized: None Level of assistance: Complete Independence       TODAY'S TREATMENT: [facility] Adult PT Treatment:                                                DATE: 12/31/21 Therapeutic Exercise: Quad set R 15x SLR R 15x SAQs R 15x R abduction in sidelie 15x R clamshell 15x FAQs with adduction 15x Bridge 15x PF against wall 15x Step ups B 10x 4 in. Step Nustep L2 8 min  [facility] Adult PT Treatment:                                                DATE: 12/26/21 Therapeutic Exercise: Quad set R 15x SLR R 15x SAQs 15x R abduction in sidelie 15x R clamshell 15x FAQs with adduction 15x Bridge 15x PF against wall 15x  Self Care: McConnell medial glide [facility] Adult PT Treatment:                                                DATE: 12/25/2021 Therapeutic Exercise: Nustep level 5 x 5 mins Standing marching/abd/ext x10 each BIL Standing heel/toe raises 2x10 Slant board stretch x45" LAQ 3x10 BIL Seated hamstring curls GTB x10 BIL Seated marching 2x10 BIL Supine clamshells GTB 2x10 SLR x10 BIL      PATIENT EDUCATION:  Education details: Discussed eval findings, rehab rationale and POC and patient is in agreement  Person educated: Patient Education method: Explanation Education comprehension: verbalized understanding and needs further education     HOME EXERCISE PROGRAM: Access Code: [email or web address] URL: [email or web address] Date: 12/25/2021 Prepared by: Harland German  Exercises - Seated Long Arc Quad  - 1 x daily - 7 x weekly - 3 sets -  10 reps - Seated March  - 1 x daily - 7 x weekly - 3 sets - 10 reps - Supine Active Straight Leg Raise  - 1 x daily - 7 x weekly - 3 sets - 10 reps - Heel Toe Raises with  Counter Support  - 1 x daily - 7 x weekly - 3 sets - 10 reps    ASSESSMENT:   CLINICAL IMPRESSION: Less pain reported, felt taping was beneficial.  Continued R quad and hip strengthening, improved quad set demonstration today.  Added additional strengthening tasks and advanced to CKC tasks and stepping.  90d SLR noted with no ITB tightness elicited.  Declined taping due to sin irritation however no skin discoloration/irritation observed.      OBJECTIVE IMPAIRMENTS decreased activity tolerance, decreased endurance, decreased knowledge of condition, decreased strength, and pain.    ACTIVITY LIMITATIONS standing, squatting, and stairs   PARTICIPATION LIMITATIONS: community activity and occupation   PERSONAL FACTORS Fitness, Past/current experiences, and language barrier  are also affecting patient's functional outcome.    REHAB POTENTIAL: Good   CLINICAL DECISION MAKING: Stable/uncomplicated   EVALUATION COMPLEXITY: Low     GOALS: Goals reviewed with patient? Yes   SHORT TERM GOALS: Target date: 12/30/2021    Patient to demonstrate independence in HEP  Baseline: Goal status: INITIAL   LONG TERM GOALS: Target date: 01/13/2022    Decrease pain to 4/10 Baseline: 8/10 Goal status: INITIAL   2.  4/5 B knee strength Baseline: 4-/5  Goal status: INITIAL   3.  Decreased 5x STS time to 15s Baseline: 20s Goal status: INITIAL     PLAN: PT FREQUENCY: 2x/week   PT DURATION: 4 weeks   PLANNED INTERVENTIONS: Therapeutic exercises, Therapeutic activity, Neuromuscular re-education, Balance training, Gait training, Patient/Family education, Joint mobilization, Stair training, and Taping   PLAN FOR NEXT SESSION: HEP update, aerobic training, PFPS protocol, VMO strengthening, taping for knee, quad set review    Hildred Laser, PT 12/31/2021, 12:16 PM

## 2022-01-01 ENCOUNTER — Ambulatory Visit: Payer: Medicaid Other

## 2022-01-08 ENCOUNTER — Ambulatory Visit: Payer: Medicaid Other

## 2022-01-08 DIAGNOSIS — M6281 Muscle weakness (generalized): Secondary | ICD-10-CM | POA: Diagnosis not present

## 2022-01-08 DIAGNOSIS — G8929 Other chronic pain: Secondary | ICD-10-CM

## 2022-01-08 DIAGNOSIS — M222X1 Patellofemoral disorders, right knee: Secondary | ICD-10-CM | POA: Diagnosis not present

## 2022-01-08 DIAGNOSIS — M25561 Pain in right knee: Secondary | ICD-10-CM | POA: Diagnosis not present

## 2022-01-08 DIAGNOSIS — R600 Localized edema: Secondary | ICD-10-CM | POA: Diagnosis not present

## 2022-01-08 DIAGNOSIS — M25562 Pain in left knee: Secondary | ICD-10-CM | POA: Diagnosis not present

## 2022-01-08 NOTE — Therapy (Signed)
OUTPATIENT PHYSICAL THERAPY TREATMENT NOTE   Patient Name: Joanna Ashley MRN: [id number] DOB:Oct 04, 1985, 36 y.o., female Today's Date: 01/08/2022  PCP: Cora Collum, DO REFERRING PROVIDER: Nestor Ramp, MD  END OF SESSION:   PT End of Session - 01/08/22 1841     Visit Number 5    Number of Visits 8    Date for PT Re-Evaluation 01/13/22    Authorization Type [place] [employer]    PT Start Time 1840    PT Stop Time 1920    PT Time Calculation (min) 40 min    Activity Tolerance Patient tolerated treatment well    Behavior During Therapy [facility] for tasks assessed/performed               Past Medical History:  Diagnosis Date   Anxiety    Chronic tension headaches 08/22/2019   History reviewed. No pertinent surgical history. Patient Active Problem List   Diagnosis Date Noted   Urinary frequency 12/30/2021   Patellofemoral pain syndrome of right knee 11/08/2021   Contact dermatitis 01/02/2021   UTI (urinary tract infection) 01/02/2021   Bleeding from the nose 01/02/2021   Peripheral neuropathy 11/09/2020   Dysmenorrhea 04/26/2020   Attempting to conceive 04/26/2020   Bilateral knee pain 01/04/2020   Vitamin D deficiency 03/18/2019   COVID-19 virus detected 03/18/2019    REFERRING DIAG: M22.2X1 (ICD-10-CM) - Patellofemoral pain syndrome of right knee   THERAPY DIAG:  Chronic pain of left knee  Chronic pain of right knee  Muscle weakness (generalized)  Rationale for Evaluation and Treatment Rehabilitation  PERTINENT HISTORY: R > L but B knee pain. Right is bothering her most now and typically the right is worse.Pain is ongoing 3 years. Worse with stairs and standiong.  Anterior knee but also aches deep inside the joint.  Sometimes it bothers her at night.  Makes the whole leg feel uncomfortable.  No giving way although he occasionally feels slightly unstable.  She recalls no specific incident to either knee.  She has had x-rays.   PRECAUTIONS: None  ONSET DATE: 11/08/2021    SUBJECTIVE: Reporting high pain levels and was unable to work today.  Inquiring if pain would be permanent and assured pain would resolve when strength improved to functional level.  PAIN:  Are you having pain? Yes: NPRS scale: 6/10 Pain location: B knees (R>L) Pain description: ache Aggravating factors: stairs, standing Relieving factors: rest meds ice   OBJECTIVE: (objective measures completed at initial evaluation unless otherwise dated)   DIAGNOSTIC FINDINGS: 4 view complete knee of both right and left done on 530 12022: I reviewed the images. I agree with the findings of some moderate patellofemoral degenerative changes on the right knee.  She also has slightly laterally slanted patella more notable on the right.  Mild joint space narrowing laterallySeen on the right as well.   PATIENT SURVEYS:  Not tested due to language    COGNITION:           Overall cognitive status: Within functional limits for tasks assessed                          SENSATION: Not tested       MUSCLE LENGTH: Hamstrings: Right 70 deg; Left 70 deg     POSTURE: No Significant postural limitations   PALPATION: Good patellar mobility   LOWER EXTREMITY ROM: WFL throughout   Active ROM Right eval Left eval  Hip flexion  Hip extension      Hip abduction      Hip adduction      Hip internal rotation      Hip external rotation      Knee flexion      Knee extension      Ankle dorsiflexion      Ankle plantarflexion      Ankle inversion      Ankle eversion       (Blank rows = not tested)   LOWER EXTREMITY MMT:   MMT Right eval Left eval  Hip flexion 4 4  Hip extension 4 4  Hip abduction 4 4  Hip adduction      Hip internal rotation      Hip external rotation      Knee flexion 4- 4-  Knee extension 4- 4-  Ankle dorsiflexion      Ankle plantarflexion      Ankle inversion      Ankle eversion       (Blank rows = not tested)   LOWER EXTREMITY SPECIAL TESTS:  Hip special  tests: ITB WFL Knee special tests: Patellafemoral apprehension test: positive  and Patellafemoral grind test: positive    FUNCTIONAL TESTS:  5 times sit to stand: 20s   GAIT: Distance walked: 68ft x2 Assistive device utilized: None Level of assistance: Complete Independence       TODAY'S TREATMENT: [facility] Adult PT Treatment:                                                DATE: 01/08/22 Therapeutic Exercise: Quad set B 15x SLR B 15x 2# SAQs B 15x R abduction in sidelie 15x FAQs with adduction 2# 15x Single leg Bridge 10/10 Nustep L4 6 min  [facility] Adult PT Treatment:                                                DATE: 12/31/21 Therapeutic Exercise: Quad set R 15x SLR R 15x SAQs R 15x R abduction in sidelie 15x R clamshell 15x FAQs with adduction 15x Bridge 15x PF against wall 15x Step ups B 10x 4 in. Step Nustep L2 8 min  [facility] Adult PT Treatment:                                                DATE: 12/26/21 Therapeutic Exercise: Quad set R 15x SLR R 15x SAQs 15x R abduction in sidelie 15x R clamshell 15x FAQs with adduction 15x Bridge 15x PF against wall 15x  Self Care: McConnell medial glide      PATIENT EDUCATION:  Education details: Discussed eval findings, rehab rationale and POC and patient is in agreement  Person educated: Patient Education method: Explanation Education comprehension: verbalized understanding and needs further education     HOME EXERCISE PROGRAM: Access Code: [email or web address] URL: [email or web address] Date: 12/25/2021 Prepared by: Harland German  Exercises - Seated Long Arc Quad  - 1 x daily - 7 x weekly - 3 sets - 10 reps - Seated March  - 1 x daily - 7 x weekly -  3 sets - 10 reps - Supine Active Straight Leg Raise  - 1 x daily - 7 x weekly - 3 sets - 10 reps - Heel Toe Raises with Counter Support  - 1 x daily - 7 x weekly - 3 sets - 10 reps    ASSESSMENT:   CLINICAL IMPRESSION: Todays session focused on regaining VMO  function and strength and ensuring patient is able to generate a functional contraction.  Emphasized B QS encouraging heel lifts as well as a 3 second hold to activate VMO.  With cuing patient able to perform exercises properly.  Advanced to single leg bridges to strength glutes.  Improved B VMO activity palpated today      OBJECTIVE IMPAIRMENTS decreased activity tolerance, decreased endurance, decreased knowledge of condition, decreased strength, and pain.    ACTIVITY LIMITATIONS standing, squatting, and stairs   PARTICIPATION LIMITATIONS: community activity and occupation   PERSONAL FACTORS Fitness, Past/current experiences, and language barrier  are also affecting patient's functional outcome.    REHAB POTENTIAL: Good   CLINICAL DECISION MAKING: Stable/uncomplicated   EVALUATION COMPLEXITY: Low     GOALS: Goals reviewed with patient? Yes   SHORT TERM GOALS: Target date: 12/30/2021    Patient to demonstrate independence in HEP  Baseline: Goal status: INITIAL   LONG TERM GOALS: Target date: 01/13/2022    Decrease pain to 4/10 Baseline: 8/10 Goal status: INITIAL   2.  4/5 B knee strength Baseline: 4-/5  Goal status: INITIAL   3.  Decreased 5x STS time to 15s Baseline: 20s Goal status: INITIAL     PLAN: PT FREQUENCY: 2x/week   PT DURATION: 4 weeks   PLANNED INTERVENTIONS: Therapeutic exercises, Therapeutic activity, Neuromuscular re-education, Balance training, Gait training, Patient/Family education, Joint mobilization, Stair training, and Taping   PLAN FOR NEXT SESSION: HEP update, aerobic training, PFPS protocol, VMO strengthening, taping for knee, quad set review    Hildred Laser, PT 01/08/2022, 7:20 PM

## 2022-01-22 ENCOUNTER — Ambulatory Visit: Payer: Medicaid Other | Attending: Family Medicine

## 2022-01-22 DIAGNOSIS — M25562 Pain in left knee: Secondary | ICD-10-CM | POA: Diagnosis not present

## 2022-01-22 DIAGNOSIS — M6281 Muscle weakness (generalized): Secondary | ICD-10-CM | POA: Insufficient documentation

## 2022-01-22 DIAGNOSIS — M25561 Pain in right knee: Secondary | ICD-10-CM | POA: Insufficient documentation

## 2022-01-22 DIAGNOSIS — G8929 Other chronic pain: Secondary | ICD-10-CM | POA: Diagnosis present

## 2022-01-22 DIAGNOSIS — R600 Localized edema: Secondary | ICD-10-CM | POA: Diagnosis present

## 2022-01-22 NOTE — Therapy (Signed)
OUTPATIENT PHYSICAL THERAPY TREATMENT NOTE   Patient Name: Joanna Ashley MRN: [id number] DOB:1986-06-29, 36 y.o., female Today's Date: 01/22/2022  PCP: Shary Key, DO REFERRING PROVIDER: Dickie La, MD  END OF SESSION:   PT End of Session - 01/22/22 1835     Visit Number 6    Number of Visits 8    Date for PT Re-Evaluation 01/13/22    Authorization Type [place] [employer]    PT Start Time 1833    PT Stop Time 1911    PT Time Calculation (min) 38 min    Activity Tolerance Patient tolerated treatment well    Behavior During Therapy [facility] for tasks assessed/performed                Past Medical History:  Diagnosis Date   Anxiety    Chronic tension headaches 08/22/2019   History reviewed. No pertinent surgical history. Patient Active Problem List   Diagnosis Date Noted   Urinary frequency 12/30/2021   Patellofemoral pain syndrome of right knee 11/08/2021   Contact dermatitis 01/02/2021   UTI (urinary tract infection) 01/02/2021   Bleeding from the nose 01/02/2021   Peripheral neuropathy 11/09/2020   Dysmenorrhea 04/26/2020   Attempting to conceive 04/26/2020   Bilateral knee pain 01/04/2020   Vitamin D deficiency 03/18/2019   COVID-19 virus detected 03/18/2019    REFERRING DIAG: M22.2X1 (ICD-10-CM) - Patellofemoral pain syndrome of right knee   THERAPY DIAG:  Chronic pain of left knee  Chronic pain of right knee  Muscle weakness (generalized)  Rationale for Evaluation and Treatment Rehabilitation  PERTINENT HISTORY: R > L but B knee pain. Right is bothering her most now and typically the right is worse.Pain is ongoing 3 years. Worse with stairs and standiong.  Anterior knee but also aches deep inside the joint.  Sometimes it bothers her at night.  Makes the whole leg feel uncomfortable.  No giving way although he occasionally feels slightly unstable.  She recalls no specific incident to either knee.  She has had x-rays.   PRECAUTIONS: None  ONSET DATE: 11/08/2021    SUBJECTIVE: Utilized phone interpreter throughout session:  Patient reports that her pain has improved a lot but that last session she was in a lot of pain and that her pain isn't constant, some days are worse than others.   PAIN:  Are you having pain? Yes: NPRS scale: 0/10 Pain location: B knees (R>L) Pain description: ache Aggravating factors: stairs, standing Relieving factors: rest meds ice   OBJECTIVE: (objective measures completed at initial evaluation unless otherwise dated)   DIAGNOSTIC FINDINGS: 4 view complete knee of both right and left done on 530 12022: I reviewed the images. I agree with the findings of some moderate patellofemoral degenerative changes on the right knee.  She also has slightly laterally slanted patella more notable on the right.  Mild joint space narrowing laterallySeen on the right as well.   PATIENT SURVEYS:  Not tested due to language    COGNITION:           Overall cognitive status: Within functional limits for tasks assessed                          SENSATION: Not tested       MUSCLE LENGTH: Hamstrings: Right 70 deg; Left 70 deg     POSTURE: No Significant postural limitations   PALPATION: Good patellar mobility   LOWER EXTREMITY ROM: WFL throughout  Active ROM Right eval Left eval  Hip flexion      Hip extension      Hip abduction      Hip adduction      Hip internal rotation      Hip external rotation      Knee flexion      Knee extension      Ankle dorsiflexion      Ankle plantarflexion      Ankle inversion      Ankle eversion       (Blank rows = not tested)   LOWER EXTREMITY MMT:   MMT Right eval Left eval Right 7/12 Left 7/12  Hip flexion 4 4    Hip extension 4 4    Hip abduction 4 4    Hip adduction        Hip internal rotation        Hip external rotation        Knee flexion 4- 4- 4/5 4/5  Knee extension 4- 4- 4/5 4/5  Ankle dorsiflexion        Ankle plantarflexion        Ankle inversion         Ankle eversion         (Blank rows = not tested)   LOWER EXTREMITY SPECIAL TESTS:  Hip special tests: ITB WFL Knee special tests: Patellafemoral apprehension test: positive  and Patellafemoral grind test: positive    FUNCTIONAL TESTS:  5 times sit to stand: 20s   GAIT: Distance walked: 80f x2 Assistive device utilized: None Level of assistance: Complete Independence       TODAY'S TREATMENT: [facility] Adult PT Treatment:                                                DATE: 01/22/2022 Therapeutic Exercise: 5xSTS 18 seconds SLR B 15x 2# FAQs with adduction 2# 15x Clamshell RTB 15/15 Therapeutic Activity: Re-administration of MMT and discussion of goals with pt   [facility]                                                [facility]: 01[facility] Therapeutic Exercise: Quad set B 15x SLR B 15x 2# SAQs B 15x R abduction in sidelie 15x FAQs with adduction 2# 15x Single leg Bridge 10/10 Nustep L4 6 min  [facility] Adult PT Treatment:                                                DATE: 12/31/21 Therapeutic Exercise: Quad set R 15x SLR R 15x SAQs R 15x R abduction in sidelie 15x R clamshell 15x FAQs with adduction 15x Bridge 15x PF against wall 15x Step ups B 10x 4 in. Step Nustep L2 8 min     PATIENT EDUCATION:  Education details: Discussed eval findings, rehab rationale and POC and patient is in agreement  Person educated: Patient Education method: Explanation Education comprehension: verbalized understanding and needs further education     HOME EXERCISE PROGRAM: Access Code: [email or web address] URL: [email or web address] Date: 12/25/2021 Prepared by: SEvelene Croon  Exercises - Seated Long Arc Quad  - 1 x daily - 7 x weekly - 3 sets - 10 reps - Seated March  - 1 x daily - 7 x weekly - 3 sets - 10 reps - Supine Active Straight Leg Raise  - 1 x daily - 7 x weekly - 3 sets - 10 reps - Heel Toe Raises with Counter Support  - 1 x daily - 7 x weekly - 3 sets - 10  reps    ASSESSMENT:   CLINICAL IMPRESSION:  Patient presents to PT with no current pain in her knees and reports that she is feeling overall much improved since beginning physical therapy. She reports the pain often comes and goes and isn't constant. She has met her STGs and most of her LTGs, except for 5xSTS goal with 18 seconds achieved today. Patient was able to tolerate all prescribed exercises with no adverse effects. Patient continues to benefit from skilled PT services and should be progressed as able to improve functional independence.     OBJECTIVE IMPAIRMENTS decreased activity tolerance, decreased endurance, decreased knowledge of condition, decreased strength, and pain.    ACTIVITY LIMITATIONS standing, squatting, and stairs   PARTICIPATION LIMITATIONS: community activity and occupation   [place], Past/current experiences, and language barrier  are also affecting patient's functional outcome.    REHAB POTENTIAL: Good   CLINICAL DECISION MAKING: Stable/uncomplicated   EVALUATION COMPLEXITY: Low     GOALS: Goals reviewed with patient? Yes   SHORT TERM GOALS: Target date: 12/30/2021    Patient to demonstrate independence in HEP  Baseline: Goal status: MET reports adherence 7/12   LONG TERM GOALS: Target date: 01/13/2022    Decrease pain to 4/10 Baseline: 8/10 Goal status: MET with 0/10 reported on 7/12   2.  4/5 B knee strength Baseline: 4-/5  Goal status: MET 01/22/22 with 4/5 on BIL knees   3.  Decreased 5x STS time to 15s Baseline: 20s Goal status: Progressing with 18 sec 01/22/22     PLAN: PT FREQUENCY: 2x/week   PT DURATION: 4 weeks   PLANNED INTERVENTIONS: Therapeutic exercises, Therapeutic activity, Neuromuscular re-education, Balance training, Gait training, Patient/Family education, Joint mobilization, Stair training, and Taping   PLAN FOR NEXT SESSION: HEP update, aerobic training, PFPS protocol, VMO strengthening, quad set  review    Evelene Croon, PTA 01/22/2022, 6:39 PM

## 2022-01-27 ENCOUNTER — Encounter: Payer: Self-pay | Admitting: Physical Therapy

## 2022-01-27 ENCOUNTER — Ambulatory Visit: Payer: Medicaid Other | Admitting: Physical Therapy

## 2022-01-27 DIAGNOSIS — G8929 Other chronic pain: Secondary | ICD-10-CM

## 2022-01-27 DIAGNOSIS — R600 Localized edema: Secondary | ICD-10-CM

## 2022-01-27 DIAGNOSIS — M25562 Pain in left knee: Secondary | ICD-10-CM | POA: Diagnosis not present

## 2022-01-27 DIAGNOSIS — M6281 Muscle weakness (generalized): Secondary | ICD-10-CM

## 2022-01-27 NOTE — Therapy (Signed)
OUTPATIENT PHYSICAL THERAPY TREATMENT NOTE   Patient Name: Joanna Ashley MRN: [id number] DOB:03/30/1986, 36 y.o., female Today's Date: 01/27/2022  PCP: Paige, Victoria J, DO REFERRING PROVIDER: Neal, Sara L, MD  END OF SESSION:   PT End of Session - 01/27/22 1426     Visit Number 7    Number of Visits 8    Date for PT Re-Evaluation 01/13/22    Authorization Type [place] [facility]    Authorization Time Period 7/13- 8/11    Authorization - Visit Number 1    Authorization - Number of Visits 4    PT Start Time 1420    PT Stop Time 1503    PT Time Calculation (min) 43 min    Activity Tolerance Patient tolerated treatment well    Behavior During Therapy WFL for tasks assessed/performed                Past Medical History:  Diagnosis Date   Anxiety    Chronic tension headaches 08/22/2019   History reviewed. No pertinent surgical history. Patient Active Problem List   Diagnosis Date Noted   Urinary frequency 12/30/2021   Patellofemoral pain syndrome of right knee 11/08/2021   Contact dermatitis 01/02/2021   UTI (urinary tract infection) 01/02/2021   Bleeding from the nose 01/02/2021   Peripheral neuropathy 11/09/2020   Dysmenorrhea 04/26/2020   Attempting to conceive 04/26/2020   Bilateral knee pain 01/04/2020   Vitamin D deficiency 03/18/2019   COVID-19 virus detected 03/18/2019    REFERRING DIAG: M22.2X1 (ICD-10-CM) - Patellofemoral pain syndrome of right knee   THERAPY DIAG:  Chronic pain of left knee  Chronic pain of right knee  Muscle weakness (generalized)  Localized edema  Rationale for Evaluation and Treatment Rehabilitation  PERTINENT HISTORY: R > L but B knee pain. Right is bothering her most now and typically the right is worse.Pain is ongoing 3 years. Worse with stairs and standiong.  Anterior knee but also aches deep inside the joint.  Sometimes it bothers her at night.  Makes the whole leg feel uncomfortable.  No giving way although he occasionally feels  slightly unstable.  She recalls no specific incident to either knee.  She has had x-rays.   PRECAUTIONS: None  ONSET DATE: 11/08/2021   SUBJECTIVE: Utilized video/[employer] interpreter throughout session: Patient prefers NOT to have the video on her while in session, audio only.  Doing OK It was really hurting last night and swollen. Its up and down.    PAIN:  Are you having pain? Yes: NPRS scale: 5/10 Pain location:Rt knee 5/10 Pain description: ache Aggravating factors: stairs, standing Relieving factors: rest meds ice   OBJECTIVE: (objective measures completed at initial evaluation unless otherwise dated)   DIAGNOSTIC FINDINGS: 4 view complete knee of both right and left done on 530 12022: I reviewed the images. I agree with the findings of some moderate patellofemoral degenerative changes on the right knee.  She also has slightly laterally slanted patella more notable on the right.  Mild joint space narrowing laterallySeen on the right as well.   PATIENT SURVEYS:  Not tested due to language    COGNITION:           Overall cognitive status: Within functional limits for tasks assessed                          SENSATION: Not tested       MUSCLE LENGTH: Hamstrings: Right 70 deg; Left 70   deg     POSTURE: No Significant postural limitations   PALPATION: Good patellar mobility   LOWER EXTREMITY ROM: WFL throughout   Active ROM Right eval Left eval  Hip flexion      Hip extension      Hip abduction      Hip adduction      Hip internal rotation      Hip external rotation      Knee flexion      Knee extension      Ankle dorsiflexion      Ankle plantarflexion      Ankle inversion      Ankle eversion       (Blank rows = not tested)   LOWER EXTREMITY MMT:   MMT Right eval Left eval Right 7/12 Left 7/12  Hip flexion 4 4    Hip extension 4 4    Hip abduction 4 4    Hip adduction        Hip internal rotation        Hip external rotation        Knee flexion 4-  4- 4/5 4/5  Knee extension 4- 4- 4/5 4/5  Ankle dorsiflexion        Ankle plantarflexion        Ankle inversion        Ankle eversion         (Blank rows = not tested)   LOWER EXTREMITY SPECIAL TESTS:  Hip special tests: ITB WFL Knee special tests: Patellafemoral apprehension test: positive  and Patellafemoral grind test: positive    FUNCTIONAL TESTS:  5 times sit to stand: 20s   GAIT: Distance walked: 75ft x2 Assistive device utilized: None Level of assistance: Complete Independence       TODAY'S TREATMENT:  [facility] Adult PT Treatment:                                                DATE: 01/27/22 Therapeutic Exercise: Recumbent bike 5 min L2  Standing heel raise x 20  Hip abduction x 20 each side  Squat at counter top x 15 , cues for hip abd/ER  Wall sit 10 sec x 5 , cues  Sidelying clam GTB x 15  SLR with VMO focus x 10 each slow  Hip abd and adduction x 15 each   [facility] Adult PT Treatment:                                                DATE: 01/22/2022 Therapeutic Exercise: 5xSTS 18 seconds SLR B 15x 2# FAQs with adduction 2# 15x Clamshell RTB 15/15 Therapeutic Activity: Re-administration of MMT and discussion of goals with pt   [facility] Adult PT Treatment:                                                DATE: 01/08/22 Therapeutic Exercise: Quad set B 15x SLR B 15x 2# SAQs B 15x R abduction in sidelie 15x FAQs with adduction 2# 15x Single leg Bridge 10/10 Nustep L4 6 min  [facility] Adult PT   Treatment:                                                DATE: 12/31/21 Therapeutic Exercise: Quad set R 15x SLR R 15x SAQs R 15x R abduction in sidelie 15x R clamshell 15x FAQs with adduction 15x Bridge 15x PF against wall 15x Step ups B 10x 4 in. Step Nustep L2 8 min     PATIENT EDUCATION:  Education details: Discussed eval findings, rehab rationale and POC and patient is in agreement  Person educated: Patient Education method: Explanation Education comprehension:  verbalized understanding and needs further education     HOME EXERCISE PROGRAM: Access Code: [email or web address] URL: [email or web address] Date: 12/25/2021 Prepared by: Evelene Croon  Exercises - Seated Long Arc Quad  - 1 x daily - 7 x weekly - 3 sets - 10 reps - Seated March  - 1 x daily - 7 x weekly - 3 sets - 10 reps - Supine Active Straight Leg Raise  - 1 x daily - 7 x weekly - 3 sets - 10 reps - Heel Toe Raises with Counter Support  - 1 x daily - 7 x weekly - 3 sets - 10 reps    ASSESSMENT:   CLINICAL IMPRESSION:  Patient reports the pain often comes and goes. She spends about 15 min in AM on HEP daily, 20 min-30 min end of day.  Worked on proper alignment in closed chain and strengthening hips. Cont POC.     OBJECTIVE IMPAIRMENTS decreased activity tolerance, decreased endurance, decreased knowledge of condition, decreased strength, and pain.    ACTIVITY LIMITATIONS standing, squatting, and stairs   PARTICIPATION LIMITATIONS: community activity and occupation   [place], Past/current experiences, and language barrier  are also affecting patient's functional outcome.    REHAB POTENTIAL: Good   CLINICAL DECISION MAKING: Stable/uncomplicated   EVALUATION COMPLEXITY: Low     GOALS: Goals reviewed with patient? Yes   SHORT TERM GOALS: Target date: 12/30/2021    Patient to demonstrate independence in HEP  Baseline: Goal status: MET reports adherence 7/12   LONG TERM GOALS: Target date: 01/13/2022    Decrease pain to 4/10 Baseline: 8/10 Goal status: MET with 0/10 reported on 7/12   2.  4/5 B knee strength Baseline: 4-/5  Goal status: MET 01/22/22 with 4/5 on BIL knees   3.  Decreased 5x STS time to 15s Baseline: 20s Goal status: Progressing with 18 sec 01/22/22     PLAN: PT FREQUENCY: 2x/week   PT DURATION: 4 weeks   PLANNED INTERVENTIONS: Therapeutic exercises, Therapeutic activity, Neuromuscular re-education, Balance training, Gait  training, Patient/Family education, Joint mobilization, Stair training, and Taping   PLAN FOR NEXT SESSION: HEP update, aerobic training, PFPS protocol, VMO strengthening, quad set review    Oakes Mccready, PT 01/27/2022, 4:49 PM  Raeford Razor, PT 01/27/22 4:52 PM Phone: [phone] Fax: [phone]

## 2022-02-05 ENCOUNTER — Ambulatory Visit: Payer: Medicaid Other

## 2022-02-05 DIAGNOSIS — M6281 Muscle weakness (generalized): Secondary | ICD-10-CM

## 2022-02-05 DIAGNOSIS — G8929 Other chronic pain: Secondary | ICD-10-CM

## 2022-02-05 DIAGNOSIS — M25562 Pain in left knee: Secondary | ICD-10-CM | POA: Diagnosis not present

## 2022-02-05 NOTE — Therapy (Addendum)
OUTPATIENT PHYSICAL THERAPY TREATMENT NOTE/DC SUMMARY   Patient Name: Joanna Ashley MRN: [id number] DOB:08/27/1985, 36 y.o., female Today's Date: 02/05/2022  PCP: Shary Key, DO REFERRING PROVIDER: Dickie La, MD PHYSICAL THERAPY DISCHARGE SUMMARY  Visits from Start of Care: 8  Current functional level related to goals / functional outcomes: Goals met   Remaining deficits: Strength deficits   Education / Equipment: HEP   Patient agrees to discharge. Patient goals were met. Patient is being discharged due to being pleased with the current functional level.  END OF SESSION:   PT End of Session - 02/05/22 1050     Visit Number 8    Number of Visits 8    Date for PT Re-Evaluation 01/13/22    Authorization Type [place] [facility]    Authorization Time Period 7/13- 8/11    Authorization - Visit Number 2    Authorization - Number of Visits 4    PT Start Time 1049    PT Stop Time 1122    PT Time Calculation (min) 33 min    Activity Tolerance Patient tolerated treatment well    Behavior During Therapy WFL for tasks assessed/performed                 Past Medical History:  Diagnosis Date   Anxiety    Chronic tension headaches 08/22/2019   History reviewed. No pertinent surgical history. Patient Active Problem List   Diagnosis Date Noted   Urinary frequency 12/30/2021   Patellofemoral pain syndrome of right knee 11/08/2021   Contact dermatitis 01/02/2021   UTI (urinary tract infection) 01/02/2021   Bleeding from the nose 01/02/2021   Peripheral neuropathy 11/09/2020   Dysmenorrhea 04/26/2020   Attempting to conceive 04/26/2020   Bilateral knee pain 01/04/2020   Vitamin D deficiency 03/18/2019   COVID-19 virus detected 03/18/2019    REFERRING DIAG: M22.2X1 (ICD-10-CM) - Patellofemoral pain syndrome of right knee   THERAPY DIAG:  Chronic pain of left knee  Chronic pain of right knee  Muscle weakness (generalized)  Rationale for Evaluation and Treatment  Rehabilitation  PERTINENT HISTORY: R > L but B knee pain. Right is bothering her most now and typically the right is worse.Pain is ongoing 3 years. Worse with stairs and standiong.  Anterior knee but also aches deep inside the joint.  Sometimes it bothers her at night.  Makes the whole leg feel uncomfortable.  No giving way although he occasionally feels slightly unstable.  She recalls no specific incident to either knee.  She has had x-rays.   PRECAUTIONS: None  ONSET DATE: 11/08/2021   SUBJECTIVE: Utilized son for interpretation throughout session.   PAIN:  Are you having pain? Yes: NPRS scale: 4/10 Pain location:Rt knee Pain description: ache Aggravating factors: stairs, standing Relieving factors: rest meds ice   OBJECTIVE: (objective measures completed at initial evaluation unless otherwise dated)   DIAGNOSTIC FINDINGS: 4 view complete knee of both right and left done on 530 12022: I reviewed the images. I agree with the findings of some moderate patellofemoral degenerative changes on the right knee.  She also has slightly laterally slanted patella more notable on the right.  Mild joint space narrowing laterallySeen on the right as well.   PATIENT SURVEYS:  Not tested due to language    COGNITION:           Overall cognitive status: Within functional limits for tasks assessed  SENSATION: Not tested       MUSCLE LENGTH: Hamstrings: Right 70 deg; Left 70 deg     POSTURE: No Significant postural limitations   PALPATION: Good patellar mobility   LOWER EXTREMITY ROM: WFL throughout   Active ROM Right eval Left eval  Hip flexion      Hip extension      Hip abduction      Hip adduction      Hip internal rotation      Hip external rotation      Knee flexion      Knee extension      Ankle dorsiflexion      Ankle plantarflexion      Ankle inversion      Ankle eversion       (Blank rows = not tested)   LOWER EXTREMITY MMT:   MMT  Right eval Left eval Right 7/12 Left 7/12  Hip flexion 4 4    Hip extension 4 4    Hip abduction 4 4    Hip adduction        Hip internal rotation        Hip external rotation        Knee flexion 4- 4- 4/5 4/5  Knee extension 4- 4- 4/5 4/5  Ankle dorsiflexion        Ankle plantarflexion        Ankle inversion        Ankle eversion         (Blank rows = not tested)   LOWER EXTREMITY SPECIAL TESTS:  Hip special tests: ITB WFL Knee special tests: Patellafemoral apprehension test: positive  and Patellafemoral grind test: positive    FUNCTIONAL TESTS:  5 times sit to stand: 20s 02/05/22: 15s   GAIT: Distance walked: 75ft x2 Assistive device utilized: None Level of assistance: Complete Independence       TODAY'S TREATMENT: [facility] Adult PT Treatment:                                                DATE: 02/05/2022 Therapeutic Exercise: LAQ with 2" hold at top x10 BIL Seated marching x10 BIL Supine quad set 5" hold x10 BIL Recumbent bike level 2 x 10 mins SLR x10 BIL Heel/toe raises at counter 2x10 5xSTS 15 seconds Therapeutic Activities: HEP review, discussion of goals and discharge from PT   [facility] Adult PT Treatment:                                                DATE: 01/27/22 Therapeutic Exercise: Recumbent bike 5 min L2  Standing heel raise x 20  Hip abduction x 20 each side  Squat at counter top x 15 , cues for hip abd/ER  Wall sit 10 sec x 5 , cues  Sidelying clam GTB x 15  SLR with VMO focus x 10 each slow  Hip abd and adduction x 15 each   [facility] Adult PT Treatment:                                                  DATE: 01/22/2022 Therapeutic Exercise: 5xSTS 18 seconds SLR B 15x 2# FAQs with adduction 2# 15x Clamshell RTB 15/15 Therapeutic Activity: Re-administration of MMT and discussion of goals with pt     PATIENT EDUCATION:  Education details: Discussed eval findings, rehab rationale and POC and patient is in agreement  Person educated:  Patient Education method: Explanation Education comprehension: verbalized understanding and needs further education     HOME EXERCISE PROGRAM: Access Code: [email or web address] URL: [email or web address] Date: 12/25/2021 Prepared by: Stephanie Benoit  Exercises - Seated Long Arc Quad  - 1 x daily - 7 x weekly - 3 sets - 10 reps - Seated March  - 1 x daily - 7 x weekly - 3 sets - 10 reps - Supine Active Straight Leg Raise  - 1 x daily - 7 x weekly - 3 sets - 10 reps - Heel Toe Raises with Counter Support  - 1 x daily - 7 x weekly - 3 sets - 10 reps    ASSESSMENT:   CLINICAL IMPRESSION:  Patient presents to PT with continued, though lessened, pain in bilateral knees, R>L. She reports HEP compliance. Session today focused on HEP review, discussion of goals, and answering any questions patient had about discharging from PT. She has met all of her goals at this time, demonstrates understanding of each exercise and the importance of working on them daily. Patient is appropriate for discharge at this time.  OBJECTIVE IMPAIRMENTS decreased activity tolerance, decreased endurance, decreased knowledge of condition, decreased strength, and pain.    ACTIVITY LIMITATIONS standing, squatting, and stairs   PARTICIPATION LIMITATIONS: community activity and occupation   PERSONAL FACTORS F[place], Past/current experiences, and language barrier  are also affecting patient's functional outcome.    REHAB POTENTIAL: Good   CLINICAL DECISION MAKING: Stable/uncomplicated   EVALUATION COMPLEXITY: Low     GOALS: Goals reviewed with patient? Yes   SHORT TERM GOALS: Target date: 12/30/2021    Patient to demonstrate independence in HEP  Baseline: Goal status: MET reports adherence 7/12   LONG TERM GOALS: Target date: 01/13/2022    Decrease pain to 4/10 Baseline: 8/10 Goal status: MET with 0/10 reported on 7/12   2.  4/5 B knee strength Baseline: 4-/5  Goal status: MET 01/22/22 with 4/5 on BIL  knees   3.  Decreased 5x STS time to 15s Baseline: 20s Goal status: Progressing with 18 sec 01/22/22 MET 02/05/22 15sec     PLAN: PT FREQUENCY: 2x/week   PT DURATION: 4 weeks   PLANNED INTERVENTIONS: Therapeutic exercises, Therapeutic activity, Neuromuscular re-education, Balance training, Gait training, Patient/Family education, Joint mobilization, Stair training, and Taping   PLAN FOR NEXT SESSION: DC    Stephanie Benoit, PTA 02/05/2022, 11:23 AM    

## 2022-03-01 ENCOUNTER — Other Ambulatory Visit: Payer: Self-pay

## 2022-03-01 ENCOUNTER — Encounter (HOSPITAL_COMMUNITY): Payer: Self-pay | Admitting: Emergency Medicine

## 2022-03-01 ENCOUNTER — Emergency Department (HOSPITAL_COMMUNITY)
Admission: EM | Admit: 2022-03-01 | Discharge: 2022-03-01 | Disposition: A | Discharge status: Home / Self Care | Payer: Medicaid Other | Attending: Emergency Medicine | Admitting: Emergency Medicine

## 2022-03-01 DIAGNOSIS — N939 Abnormal uterine and vaginal bleeding, unspecified: Secondary | ICD-10-CM | POA: Diagnosis not present

## 2022-03-01 DIAGNOSIS — N9489 Other specified conditions associated with female genital organs and menstrual cycle: Secondary | ICD-10-CM | POA: Diagnosis not present

## 2022-03-01 LAB — CBC
HCT: 36.4 % (ref 36.0–46.0)
Hemoglobin: 12.7 g/dL (ref 12.0–15.0)
MCH: 31.2 pg (ref 26.0–34.0)
MCHC: 34.9 g/dL (ref 30.0–36.0)
MCV: 89.4 fL (ref 80.0–100.0)
Platelets: 383 10*3/uL (ref 150–400)
RBC: 4.07 MIL/uL (ref 3.87–5.11)
RDW: 11.9 % (ref 11.5–15.5)
WBC: 7.7 10*3/uL (ref 4.0–10.5)
nRBC: 0 % (ref 0.0–0.2)

## 2022-03-01 LAB — TYPE AND SCREEN
ABO/RH(D): O NEG
Antibody Screen: NEGATIVE

## 2022-03-01 LAB — COMPREHENSIVE METABOLIC PANEL
ALT: 23 U/L (ref 0–44)
AST: 23 U/L (ref 15–41)
Albumin: 3.9 g/dL (ref 3.5–5.0)
Alkaline Phosphatase: 55 U/L (ref 38–126)
Anion gap: 8 (ref 5–15)
BUN: <5 mg/dL — ABNORMAL LOW (ref 6–20)
CO2: 24 mmol/L (ref 22–32)
Calcium: 9.3 mg/dL (ref 8.9–10.3)
Chloride: 106 mmol/L (ref 98–111)
Creatinine, Ser: 0.48 mg/dL (ref 0.44–1.00)
GFR, Estimated: >60 mL/min (ref >60)
Glucose, Bld: 91 mg/dL (ref 70–99)
Potassium: 3.5 mmol/L (ref 3.5–5.1)
Sodium: 138 mmol/L (ref 135–145)
Total Bilirubin: 0.9 mg/dL (ref 0.3–1.2)
Total Protein: 6.6 g/dL (ref 6.5–8.1)

## 2022-03-01 LAB — WET PREP, GENITAL
Clue Cells Wet Prep HPF POC: NONE SEEN
Sperm: NONE SEEN
Trich, Wet Prep: NONE SEEN
WBC, Wet Prep HPF POC: <10 (ref <10)
Yeast Wet Prep HPF POC: NONE SEEN

## 2022-03-01 LAB — I-STAT BETA HCG BLOOD, ED (MC, WL, AP ONLY): I-stat hCG, quantitative: <5 m[IU]/mL (ref <5)

## 2022-03-01 LAB — ABO/RH: ABO/RH(D): O NEG

## 2022-03-01 NOTE — Discharge Instructions (Signed)
Your evaluation today shows normal hemoglobin and  your bleeding seems to have stopped.  Follow up with Gynecology for evaltuion

## 2022-03-01 NOTE — ED Notes (Signed)
Patient verbalizes understanding of discharge instructions. Opportunity for questioning and answers were provided. Armband removed by staff, pt discharged from ED ambulatory. Interpreter used to go over DC papers.

## 2022-03-01 NOTE — ED Provider Notes (Signed)
[facility] EMERGENCY DEPARTMENT Provider Note   CSN: [id number] Arrival date & time: 03/01/22  1555     History  Chief Complaint  Patient presents with   Vaginal Bleeding    Joanna Ashley is a 36 y.o. female.  The history is provided by the patient. No language interpreter was used.  Vaginal Bleeding Quality:  Heavier than menses Severity:  Severe Onset quality:  Gradual Duration:  10 days Timing:  Constant Progression:  Worsening Chronicity:  New Menstrual history:  Irregular Relieved by:  Nothing Worsened by:  Nothing Ineffective treatments:  None tried Associated symptoms: no abdominal pain   Risk factors: no bleeding disorder and no STD        Home Medications Prior to Admission medications   Not on File      Allergies    Other    Review of Systems   Review of Systems  Gastrointestinal:  Negative for abdominal pain.  Genitourinary:  Positive for vaginal bleeding.  All other systems reviewed and are negative.   Physical Exam Updated Vital Signs BP 124/72   Pulse 70   Temp 98.3 F (36.8 C)   Resp 16   SpO2 98%  Physical Exam Vitals and nursing note reviewed.  Constitutional:      Appearance: She is well-developed.  HENT:     Head: Normocephalic.     Mouth/Throat:     Mouth: Mucous membranes are moist.  Eyes:     Extraocular Movements: Extraocular movements intact.     Pupils: Pupils are equal, round, and reactive to light.  Cardiovascular:     Rate and Rhythm: Normal rate and regular rhythm.     Pulses: Normal pulses.     Heart sounds: Normal heart sounds.  Pulmonary:     Effort: Pulmonary effort is normal.  Abdominal:     General: Abdomen is flat. There is no distension.  Genitourinary:    Vagina: No vaginal discharge.  Musculoskeletal:        General: Normal range of motion.     Cervical back: Normal range of motion.  Skin:    General: Skin is warm.  Neurological:     General: No focal deficit present.      Mental Status: She is alert and oriented to person, place, and time.     ED Results / Procedures / Treatments   Labs (all labs ordered are listed, but only abnormal results are displayed) Labs Reviewed  COMPREHENSIVE METABOLIC PANEL - Abnormal; Notable for the following components:      Result Value   BUN <5 (*)    All other components within normal limits  WET PREP, GENITAL  CBC  I-STAT BETA HCG BLOOD, ED ([facility], [facility], [facility] ONLY)  TYPE AND SCREEN  ABO/RH  GC/CHLAMYDIA PROBE AMP ([place]) NOT AT [facility]    EKG None  Radiology No results found.  Procedures Procedures    Medications Ordered in ED Medications - No data to display  ED Course/ Medical Decision Making/ A&P                           Medical Decision Making Patient reports that she has had a period for the last 10 days.  Patient reports bleeding has been heavier than in the past.  Patient is concerned that she could be pregnant.  Amount and/or Complexity of Data Reviewed External Data Reviewed: notes.    Details: Gynecology notes from Dr.  Constant reviewed Labs: ordered. Decision-making details documented in ED Course.    Details: Labs ordered reviewed and interpreted pregnancy test is negative patient's hemoglobin is normal  Risk Risk Details: Patient is advised to follow-up with her gynecologist she is not bleeding on her pelvic exam . Her hemoglobin is normal. Pt request medication to increase her injury.  Pt advised multi vitamin.             Final Clinical Impression(s) / ED Diagnoses Final diagnoses:  Abnormal vaginal bleeding    Rx / DC Orders ED Discharge Orders     None      An After Visit Summary was printed and given to the patient.    Osie Cheeks 03/01/22 2119    Jacalyn Lefevre, MD 03/01/22 2303

## 2022-03-01 NOTE — ED Triage Notes (Signed)
Patient here with complaint of continuous vaginal bleeding since 8/9. Patient states initially she was changing her pad every four hours but is no changing every two hours. Also reports lightheadedness and generalized weakness. Patient is alert, oriented, ambulatory, and in no apparent distress at this time.

## 2022-03-03 LAB — GC/CHLAMYDIA PROBE AMP (~~LOC~~) NOT AT ARMC
Chlamydia: NEGATIVE
Comment: NEGATIVE
Comment: NORMAL
Neisseria Gonorrhea: NEGATIVE

## 2022-04-22 ENCOUNTER — Ambulatory Visit: Payer: Medicaid Other | Admitting: Sports Medicine

## 2022-04-22 VITALS — BP 104/60 | Ht 62.0 in

## 2022-04-22 DIAGNOSIS — M25561 Pain in right knee: Secondary | ICD-10-CM

## 2022-04-22 DIAGNOSIS — M25562 Pain in left knee: Secondary | ICD-10-CM | POA: Diagnosis not present

## 2022-04-22 DIAGNOSIS — G8929 Other chronic pain: Secondary | ICD-10-CM

## 2022-04-22 NOTE — Progress Notes (Signed)
   Subjective:    Patient ID: Joanna Ashley, female    DOB: 1986/05/15, 36 y.o.   MRN: [id number]  HPI chief complaint: Bilateral leg pain  Patient presents today with longstanding bilateral leg pain.  Pain seems to be centered around her knees but does radiate proximally into the thighs and distally into the lower legs.  She had similar pain in the past.  X-rays done previously which only showed some mild patellofemoral DJD on the right.  She was referred to physical therapy earlier this year and had excellent results.    Review of Systems As above    Objective:   Physical Exam  Well-developed, well-nourished.  No acute distress  Examination of her hips show smooth painless hip range of motion with a negative logroll. Examination of her knees show full range of motion.  No effusion.  2+ patellofemoral crepitus on the right.  Trace patellofemoral crepitus on the left.  Ambulating with a slight limp.      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  If she does not experience the same results that she had earlier this year then she will return to the office for reevaluation and further work-up.  Follow-up for ongoing or recalcitrant issues.  This note was dictated using Dragon naturally speaking software and may contain errors in syntax, spelling, or content which have not been identified prior to signing this note.

## 2022-04-23 NOTE — Telephone Encounter (Signed)
Note not needed 

## 2022-04-29 ENCOUNTER — Other Ambulatory Visit (HOSPITAL_COMMUNITY)
Admission: RE | Admit: 2022-04-29 | Discharge: 2022-04-29 | Disposition: A | Discharge status: Home / Self Care | Payer: Medicaid Other | Source: Ambulatory Visit | Attending: Obstetrics and Gynecology | Admitting: Obstetrics and Gynecology

## 2022-04-29 ENCOUNTER — Encounter: Payer: Self-pay | Admitting: Obstetrics and Gynecology

## 2022-04-29 ENCOUNTER — Ambulatory Visit: Payer: Medicaid Other | Admitting: Obstetrics and Gynecology

## 2022-04-29 VITALS — BP 113/72 | HR 60 | Ht 62.0 in | Wt 207.0 lb

## 2022-04-29 DIAGNOSIS — Z1329 Encounter for screening for other suspected endocrine disorder: Secondary | ICD-10-CM | POA: Diagnosis not present

## 2022-04-29 DIAGNOSIS — N979 Female infertility, unspecified: Secondary | ICD-10-CM | POA: Diagnosis not present

## 2022-04-29 DIAGNOSIS — Z01419 Encounter for gynecological examination (general) (routine) without abnormal findings: Secondary | ICD-10-CM

## 2022-04-29 DIAGNOSIS — N939 Abnormal uterine and vaginal bleeding, unspecified: Secondary | ICD-10-CM | POA: Diagnosis not present

## 2022-04-29 LAB — POCT URINALYSIS DIPSTICK
Appearance: NORMAL
Bilirubin, UA: NEGATIVE
Blood, UA: NEGATIVE
Glucose, UA: NEGATIVE
Leukocytes, UA: NEGATIVE
Nitrite, UA: NEGATIVE
Odor: NORMAL
Protein, UA: NEGATIVE
Spec Grav, UA: <=1.005 — AB (ref 1.010–1.025)
Urobilinogen, UA: 0.2 E.U./dL
pH, UA: 6 (ref 5.0–8.0)

## 2022-04-29 NOTE — Progress Notes (Signed)
Subjective:     Joanna Ashley is a 36 y.o. female P2 with LMP 04/01/22 and BMI 37 who is here for a comprehensive physical exam. The patient reports no problems. She is sexually active and is trying to conceive. She reports an episode of prolonged vaginal bleeding in August. Her period started as expected and lasted 2 weeks. Patient was seen in the ED and states that a blood transfusion was offered but patient declined. Her cycle was normal in September lasting her usual 5-7 days. She denies pelvic pain or abnormal discharge. She has been trying to conceive for several years now with the father of her other 2 children  Past Medical History:  Diagnosis Date   Anxiety    Chronic tension headaches 08/22/2019   No past surgical history on file. No family history on file.  Social History   Socioeconomic History   Marital status: Single    Spouse name: Not on file   Number of children: Not on file   Years of education: Not on file   Highest education level: Not on file  Occupational History   Not on file  Tobacco Use   Smoking status: Never   Smokeless tobacco: Never  Vaping Use   Vaping Use: Never used  Substance and Sexual Activity   Alcohol use: No   Drug use: No   Sexual activity: Yes    Partners: Male    Birth control/protection: None  Other Topics Concern   Not on file  Social History Narrative   ** Merged History Encounter **       Social Determinants of Health   Financial Resource Strain: Not on file  Food Insecurity: Not on file  Transportation Needs: Not on file  Physical Activity: Not on file  Stress: Not on file  Social Connections: Not on file  Intimate Partner Violence: Not on file   Health Maintenance  Topic Date Due   COVID-19 Vaccine (1) Never done   Hepatitis C Screening  Never done   INFLUENZA VACCINE  Never done   TETANUS/TDAP  04/30/2022   PAP SMEAR-Modifier  04/27/2023   HIV Screening  Completed   HPV VACCINES  Aged Out       Review of  Systems Pertinent items noted in HPI and remainder of comprehensive ROS otherwise negative.   Objective:  Blood pressure 113/72, pulse 60, height 5\' 2"  (1.575 m), weight 207 lb (93.9 kg), last menstrual period 04/01/2022.   GENERAL: Well-developed, well-nourished female in no acute distress.  HEENT: Normocephalic, atraumatic. Sclerae anicteric.  NECK: Supple. Normal thyroid.  LUNGS: Clear to auscultation bilaterally.  HEART: Regular rate and rhythm. BREASTS: Symmetric in size. No palpable masses or lymphadenopathy, skin changes, or nipple drainage. ABDOMEN: Soft, nontender, nondistended. No organomegaly. PELVIC: Normal external female genitalia. Vagina is pink and rugated.  Normal discharge. Normal appearing cervix. Uterus is normal in size. No adnexal mass or tenderness. Chaperone present during the pelvic exam EXTREMITIES: No cyanosis, clubbing, or edema, 2+ distal pulses.     Assessment:    Healthy female exam.      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 Also discussed referral to infertility specialist before trial of femara. Patient wants to defer referral to infertility specialist - Also advised semen analysis - Patient will be contacted  with results See After Visit Summary for Counseling Recommendations

## 2022-04-29 NOTE — Progress Notes (Signed)
GYN  Increased bleeding in August. Requests exam. Wants UA: denies burning but reports frequency.  Seen for same in June PCP. Also seen in ED for heavy bleeding in August.

## 2022-04-30 LAB — COMPREHENSIVE METABOLIC PANEL
ALT: 38 IU/L — ABNORMAL HIGH (ref 0–32)
AST: 27 IU/L (ref 0–40)
Albumin/Globulin Ratio: 1.6 (ref 1.2–2.2)
Albumin: 4.1 g/dL (ref 3.9–4.9)
Alkaline Phosphatase: 72 IU/L (ref 44–121)
BUN/Creatinine Ratio: 15 (ref 9–23)
BUN: 7 mg/dL (ref 6–20)
Bilirubin Total: 0.2 mg/dL (ref 0.0–1.2)
CO2: 22 mmol/L (ref 20–29)
Calcium: 9.3 mg/dL (ref 8.7–10.2)
Chloride: 104 mmol/L (ref 96–106)
Creatinine, Ser: 0.47 mg/dL — ABNORMAL LOW (ref 0.57–1.00)
Globulin, Total: 2.6 g/dL (ref 1.5–4.5)
Glucose: 78 mg/dL (ref 70–99)
Potassium: 3.7 mmol/L (ref 3.5–5.2)
Sodium: 140 mmol/L (ref 134–144)
Total Protein: 6.7 g/dL (ref 6.0–8.5)
eGFR: 126 mL/min/{1.73_m2} (ref >59)

## 2022-04-30 LAB — CBC
Hematocrit: 37.5 % (ref 34.0–46.6)
Hemoglobin: 12.5 g/dL (ref 11.1–15.9)
MCH: 30.3 pg (ref 26.6–33.0)
MCHC: 33.3 g/dL (ref 31.5–35.7)
MCV: 91 fL (ref 79–97)
Platelets: 381 10*3/uL (ref 150–450)
RBC: 4.13 x10E6/uL (ref 3.77–5.28)
RDW: 12.5 % (ref 11.7–15.4)
WBC: 8.2 10*3/uL (ref 3.4–10.8)

## 2022-04-30 LAB — TSH: TSH: 1.4 u[IU]/mL (ref 0.450–4.500)

## 2022-04-30 LAB — HEMOGLOBIN A1C
Est. average glucose Bld gHb Est-mCnc: 103 mg/dL
Hgb A1c MFr Bld: 5.2 % (ref 4.8–5.6)

## 2022-05-01 LAB — URINE CULTURE: Organism ID, Bacteria: NO GROWTH

## 2022-05-01 LAB — CYTOLOGY - PAP: Diagnosis: NEGATIVE

## 2022-05-02 ENCOUNTER — Ambulatory Visit (HOSPITAL_BASED_OUTPATIENT_CLINIC_OR_DEPARTMENT_OTHER)
Admission: RE | Admit: 2022-05-02 | Discharge: 2022-05-02 | Disposition: A | Discharge status: Home / Self Care | Payer: Medicaid Other | Source: Ambulatory Visit | Attending: Obstetrics and Gynecology | Admitting: Obstetrics and Gynecology

## 2022-05-02 ENCOUNTER — Ambulatory Visit: Payer: Medicaid Other | Admitting: Physical Therapy

## 2022-05-02 DIAGNOSIS — N939 Abnormal uterine and vaginal bleeding, unspecified: Secondary | ICD-10-CM | POA: Diagnosis not present

## 2022-05-02 DIAGNOSIS — D259 Leiomyoma of uterus, unspecified: Secondary | ICD-10-CM | POA: Diagnosis not present

## 2022-05-08 NOTE — Therapy (Signed)
OUTPATIENT PHYSICAL THERAPY LOWER EXTREMITY EVALUATION   Patient Name: Joanna Ashley MRN: [id number] DOB:1986/02/15, 36 y.o., female Today's Date: 05/09/2022   PT End of Session - 05/09/22 0956     Visit Number 1    Number of Visits 9    Date for PT Re-Evaluation 07/11/22    Authorization Type [employer]    Authorization Time Period Pending auth    PT Start Time 1962   Pt arrived 15 minutes late to her eval   PT Stop Time 1000    PT Time Calculation (min) 29 min    Activity Tolerance Patient tolerated treatment well    Behavior During Therapy [facility] for tasks assessed/performed             Past Medical History:  Diagnosis Date   Anxiety    Chronic tension headaches 08/22/2019   History reviewed. No pertinent surgical history. Patient Active Problem List   Diagnosis Date Noted   Urinary frequency 12/30/2021   Patellofemoral pain syndrome of right knee 11/08/2021   Contact dermatitis 01/02/2021   UTI (urinary tract infection) 01/02/2021   Bleeding from the nose 01/02/2021   Peripheral neuropathy 11/09/2020   Dysmenorrhea 04/26/2020   Attempting to conceive 04/26/2020   Bilateral knee pain 01/04/2020   Vitamin D deficiency 03/18/2019   COVID-19 virus detected 03/18/2019    PCP: Cora Collum, DO  REFERRING PROVIDER: Ralene Cork, DO  REFERRING DIAG: M25.561,M25.562,G89.29 (ICD-10-CM) - Bilateral chronic knee pain  THERAPY DIAG:  Chronic pain of left knee  Chronic pain of right knee  Muscle weakness (generalized)  Localized edema  Rationale for Evaluation and Treatment: Rehabilitation  ONSET DATE: 7 years ago  SUBJECTIVE:   SUBJECTIVE STATEMENT: Pt reports primary c/o chronic BIL knee pain lasting about 7 years following a fall in her workplace. She has had BIL knee X-rays which were negative for fx. She has also been seen in PT with some benefit in the past. Pt denies any N/T related to this problem. Aggravating factors include sitting in low  seats, standing from sitting, standing > 5 minutes, stairs. Easing factors include walking, cycling. Current pain is 4/10. Worst pain is 8/10. Best pain is 0/10.   PERTINENT HISTORY: Anxiety, chronic headaches PAIN:  Are you having pain? Yes: NPRS scale: 4/10 Pain location: BIL knees Pain description: Achy Aggravating factors: sitting in low seats, standing from sitting, standing > 5 minutes, stairs Relieving factors: walking, cycling  PRECAUTIONS: None  WEIGHT BEARING RESTRICTIONS: No  FALLS:  Has patient fallen in last 6 months? No  LIVING ENVIRONMENT: Lives with: lives with their family Lives in: House/apartment Stairs: Yes: Internal: 10 steps; on right going up, on left going up, and can reach both Has following equipment at home: None  OCCUPATION: unemployed  PLOF: Independent  PATIENT GOALS: Return to cooking, ADLs, working out with less limitation   OBJECTIVE:   DIAGNOSTIC FINDINGS:  12/11/2020: DG Knee Complete 4 Views Right: IMPRESSION: Minimal patellofemoral degenerative change  12/11/2020: DG Knee Complete 4 Views Left: IMPRESSION: Negative  PATIENT SURVEYS:  LEFS Assess at treatment 1  COGNITION: Overall cognitive status: Within functional limits for tasks assessed     SENSATION: Not tested  EDEMA:  Circumferential: 43cm on Lt, 41.5cm on Rt, from inferior patellar pole to popliteal fossa  MUSCLE LENGTH: Hamstrings: Moderate limitation BIL Thomas test: Moderate limitation BIL  POSTURE: No Significant postural limitations  PALPATION: TTP to BIL patellar tendon, medial border of patellae   PASSIVE ACCESSORIES:  Hypomobile with crepitus in all patellofemoral joint mobility  LOWER EXTREMITY ROM:  A/PROM Right eval Left eval  Knee flexion 120/125 120/125  Knee extension -2/1 -1/1   (Blank rows = not tested)  LOWER EXTREMITY MMT:  MMT Right eval Left eval  Hip flexion 3/5 3/5  Hip extension 3/5 3/5  Hip abduction 3/5 3/5  Knee flexion  4/5  4/5  Knee extension 4/5 with crepitus and pain 4/5 with crepitus and pain   (Blank rows = not tested)  LOWER EXTREMITY SPECIAL TESTS:  Patellar apprehension: (+) BIL Lateral patellar pull:(+) BIL Patellar compression: (+) BIL  FUNCTIONAL TESTS:  5xSTS: 18 seconds with pain Squat: 50%, pain Lunge: 50%, pain BIL  GAIT: Distance walked: 20 ft Assistive device utilized: None Level of assistance: Complete Independence Comments: Short stride length, antalgic BIL   TODAY'S TREATMENT:                                                                                                                               [facility] Adult PT Treatment:                                                DATE: 05/08/2022 Therapeutic Exercise: Demonstrated and issued HEP Pt education regarding potential underlying pathophysiology, POC, prognosis, and LEFS Manual Therapy: N/A Neuromuscular re-ed: N/A Therapeutic Activity: N/A Modalities: N/A Self Care: N/A    PATIENT EDUCATION:  Education details: Pt educated on prognosis, POC, potential underlying pathophysiology, LEFS, and HEP Person educated: Patient Education method: Explanation, Demonstration, and Handouts Education comprehension: verbalized understanding and returned demonstration  HOME EXERCISE PROGRAM: Access Code: [email or web address] URL: [email or web address] Date: 05/09/2022 Prepared by: Carmelina Dane  Exercises - Seated Isometric Knee Extension (Into wall, not leg)  - 1 x daily - 7 x weekly - 2 sets - 2 minutes hold - Mini Squat with Counter Support  - 1 x daily - 7 x weekly - 3 sets - 10 reps - Sidelying Hip Abduction  - 1 x daily - 7 x weekly - 3 sets - 10 reps  ASSESSMENT:  CLINICAL IMPRESSION: Patient is a 36 y.o. F who was seen today for physical therapy evaluation and treatment for chronic BIL knee pain.  Due to pt arriving 15 minutes late, the session was limited today. Upon assessment of objective measures, the  pt's primary impairments include TTP to BIL patellar tendons and medial borders of patellae, painful and limited squat, painful and limited 5xSTS, weak and painful BIL knee flexion and extension MMT, weak BIL global hip strength, and tight BIL hamstrings and hip flexors. Ruling up BIL PFPS due to positive test cluster and pain with stairs. Cannot tule out patellar tendinopathy due to TTP to BIL patellar tendons. Pt will continue to benefit from skilled PT to address her primary impairments and  return to her prior level of function with less limitation.  OBJECTIVE IMPAIRMENTS: Abnormal gait, decreased activity tolerance, decreased balance, decreased endurance, decreased mobility, difficulty walking, decreased ROM, decreased strength, hypomobility, increased edema, impaired flexibility, improper body mechanics, postural dysfunction, and pain.   ACTIVITY LIMITATIONS: carrying, lifting, bending, sitting, standing, squatting, sleeping, stairs, transfers, and locomotion level  PARTICIPATION LIMITATIONS: meal prep, cleaning, laundry, driving, shopping, community activity, occupation, and yard work  PERSONAL FACTORS: Time since onset of injury/illness/exacerbation and 1-2 comorbidities: See medical hx  are also affecting patient's functional outcome.   REHAB POTENTIAL: Good  CLINICAL DECISION MAKING: Stable/uncomplicated  EVALUATION COMPLEXITY: Low   GOALS: Goals reviewed with patient? Yes  SHORT TERM GOALS: Target date: 06/06/2022  Pt will report understanding and adherence to initial HEP in order to promote independence in the management of primary impairments. Baseline: HEP provided at eval Goal status: INITIAL    LONG TERM GOALS: Target date: 07/04/2022   Pt will achieve an LEFS score improvement of 11 points or more in order to demonstrate improved functional ability as it relates to the pt's primary impairments. Baseline: Assess LEFS on treatment 1 Goal status: INITIAL  2.  Pt will  report ability to stand 30 minutes or longer in order to cook with less limitation.  Baseline: Unable to stand >5 minutes due to pain Goal status: INITIAL  3.  Pt will achieve a 5xSTS in 12 seconds or less in order to demonstrate safe community-level transfers. Baseline: 18 seconds Goal status: INITIAL  4.  Pt will achieve WNL squat x10 in order to lift groceries from the floor with less limitation. Baseline: 50% squat depth with pain Goal status: INITIAL  5.  Pt will achieve BIL global hip and knee strength of 4+/5 or greater in order to progress her independent LE strengthening regimen with less limitation. Baseline: See MMT chart Goal status: INITIAL    PLAN:  PT FREQUENCY: 1x/week  PT DURATION: 8 weeks  PLANNED INTERVENTIONS: Therapeutic exercises, Therapeutic activity, Neuromuscular re-education, Balance training, Gait training, Patient/Family education, Self Care, Joint mobilization, Joint manipulation, Stair training, Orthotic/Fit training, Aquatic Therapy, Dry Needling, Electrical stimulation, Spinal manipulation, Spinal mobilization, Cryotherapy, Moist heat, Taping, Vasopneumatic device, Biofeedback, Ionotophoresis 4mg /ml Dexamethasone, Manual therapy, and Re-evaluation  PLAN FOR NEXT SESSION: Assess LEFS at treatment one and update LTG, progress quad reinforcement/ hip strengthening, generalized LE mobility   Check all possible CPT codes: 97164 - PT Re-evaluation, 97110- Therapeutic Exercise, [id number]- Neuro Re-education, [phone] - Gait Training, 97140 - Manual Therapy, 97530 - Therapeutic Activities, 97535 - Self Care, 97014 - Electrical stimulation (unattended), [id number] - Electrical stimulation (Manual), [id number] - Iontophoresis, [id number] - Ultrasound, [id number] - Vaso, and [id number] - Aquatic therapy    Check all conditions that are expected to impact treatment: Musculoskeletal disorders   If treatment provided at initial evaluation, no treatment charged due to lack of authorization.        Vanessa Wyocena, PT, DPT 05/09/22 11:25 AM

## 2022-05-09 ENCOUNTER — Ambulatory Visit: Payer: Medicaid Other | Attending: Sports Medicine

## 2022-05-09 ENCOUNTER — Other Ambulatory Visit: Payer: Self-pay

## 2022-05-09 DIAGNOSIS — M25561 Pain in right knee: Secondary | ICD-10-CM | POA: Diagnosis not present

## 2022-05-09 DIAGNOSIS — M6281 Muscle weakness (generalized): Secondary | ICD-10-CM | POA: Insufficient documentation

## 2022-05-09 DIAGNOSIS — M25562 Pain in left knee: Secondary | ICD-10-CM | POA: Insufficient documentation

## 2022-05-09 DIAGNOSIS — R600 Localized edema: Secondary | ICD-10-CM | POA: Insufficient documentation

## 2022-05-09 DIAGNOSIS — G8929 Other chronic pain: Secondary | ICD-10-CM | POA: Insufficient documentation

## 2022-05-15 ENCOUNTER — Ambulatory Visit: Payer: Medicaid Other | Attending: Sports Medicine

## 2022-05-15 DIAGNOSIS — M25562 Pain in left knee: Secondary | ICD-10-CM | POA: Diagnosis not present

## 2022-05-15 DIAGNOSIS — R600 Localized edema: Secondary | ICD-10-CM | POA: Diagnosis not present

## 2022-05-15 DIAGNOSIS — G8929 Other chronic pain: Secondary | ICD-10-CM | POA: Insufficient documentation

## 2022-05-15 DIAGNOSIS — M6281 Muscle weakness (generalized): Secondary | ICD-10-CM | POA: Diagnosis not present

## 2022-05-15 DIAGNOSIS — M25561 Pain in right knee: Secondary | ICD-10-CM | POA: Diagnosis not present

## 2022-05-15 NOTE — Therapy (Signed)
OUTPATIENT PHYSICAL THERAPY TREATMENT NOTE   Patient Name: Joanna Ashley MRN: [id number] DOB:[birth year], 36 y.o., female Today's Date: 05/15/2022  PCP: Cora Collum, DO  REFERRING PROVIDER: Ralene Cork, DO   END OF SESSION:   PT End of Session - 05/15/22 1025     Visit Number 2    Number of Visits 9    Date for PT Re-Evaluation 07/11/22    Authorization Type [employer]    Authorization Time Period 05/08/2022-06/07/2022    Authorization - Visit Number 2    Authorization - Number of Visits 4    PT Start Time 1030    PT Stop Time 1110    PT Time Calculation (min) 40 min    Activity Tolerance Patient tolerated treatment well    Behavior During Therapy [facility] for tasks assessed/performed             Past Medical History:  Diagnosis Date   Anxiety    Chronic tension headaches 08/22/2019   History reviewed. No pertinent surgical history. Patient Active Problem List   Diagnosis Date Noted   Urinary frequency 12/30/2021   Patellofemoral pain syndrome of right knee 11/08/2021   Contact dermatitis 01/02/2021   UTI (urinary tract infection) 01/02/2021   Bleeding from the nose 01/02/2021   Peripheral neuropathy 11/09/2020   Dysmenorrhea 04/26/2020   Attempting to conceive 04/26/2020   Bilateral knee pain 01/04/2020   Vitamin D deficiency 03/18/2019   COVID-19 virus detected 03/18/2019    REFERRING DIAG: M25.561,M25.562,G89.29 (ICD-10-CM) - Bilateral chronic knee pain   THERAPY DIAG:  Chronic pain of left knee  Chronic pain of right knee  Muscle weakness (generalized)  Localized edema  Rationale for Evaluation and Treatment Rehabilitation  PERTINENT HISTORY: Anxiety, chronic headaches    SUBJECTIVE:                                                                                                                                                                                      SUBJECTIVE STATEMENT:  Pt reports HEP adherence, although she has pain with  her exercises. She rates her pain currently as 0/10, although she reports increased pain yesterday.   PAIN:  Are you having pain? Yes: NPRS scale: 0/10 Pain location: BIL knees Pain description: Achy Aggravating factors: sitting in low seats, standing from sitting, standing > 5 minutes, stairs Relieving factors: walking, cycling   OBJECTIVE: (objective measures completed at initial evaluation unless otherwise dated)   DIAGNOSTIC FINDINGS:  12/11/2020: DG Knee Complete 4 Views Right: IMPRESSION: Minimal patellofemoral degenerative change   12/11/2020: DG Knee Complete 4 Views Left: IMPRESSION: Negative   PATIENT SURVEYS:  LEFS: 66/80  COGNITION: Overall cognitive status: Within functional limits for tasks assessed                         SENSATION: Not tested   EDEMA:  Circumferential: 43cm on Lt, 41.5cm on Rt, from inferior patellar pole to popliteal fossa   MUSCLE LENGTH: Hamstrings: Moderate limitation BIL Thomas test: Moderate limitation BIL   POSTURE: No Significant postural limitations   PALPATION: TTP to BIL patellar tendon, medial border of patellae    PASSIVE ACCESSORIES: Hypomobile with crepitus in all patellofemoral joint mobility   LOWER EXTREMITY ROM:   A/PROM Right eval Left eval  Knee flexion 120/125 120/125  Knee extension -2/1 -1/1   (Blank rows = not tested)   LOWER EXTREMITY MMT:   MMT Right eval Left eval  Hip flexion 3/5 3/5  Hip extension 3/5 3/5  Hip abduction 3/5 3/5  Knee flexion 4/5  4/5  Knee extension 4/5 with crepitus and pain 4/5 with crepitus and pain   (Blank rows = not tested)   LOWER EXTREMITY SPECIAL TESTS:  Patellar apprehension: (+) BIL Lateral patellar pull:(+) BIL Patellar compression: (+) BIL   FUNCTIONAL TESTS:  5xSTS: 18 seconds with pain Squat: 50%, pain Lunge: 50%, pain BIL   GAIT: Distance walked: 20 ft Assistive device utilized: None Level of assistance: Complete Independence Comments: Short  stride length, antalgic BIL     TODAY'S TREATMENT:                                                        [facility] Adult PT Treatment:                                                DATE: 05/15/2022 Therapeutic Exercise: 2-inch lateral heel tap with UE support 2x10 BIL Side knee plank with clamshell 2x10 BIL Bridge isometric with marching 2x20 Manual Therapy: N/A Neuromuscular re-ed: N/A Therapeutic Activity: Assessment of LEFS with pt education [place] deadlift with 10# kettlebell with cues for functional lifting form 3x8 Modalities: N/A Self Care: N/A                                                                      [facility] Adult PT Treatment:                                                DATE: 05/08/2022 Therapeutic Exercise: Demonstrated and issued HEP Pt education regarding potential underlying pathophysiology, POC, prognosis, and LEFS Manual Therapy: N/A Neuromuscular re-ed: N/A Therapeutic Activity: N/A Modalities: N/A Self Care: N/A       PATIENT EDUCATION:  Education details: Pt educated on prognosis, POC, potential underlying pathophysiology, LEFS, and HEP Person educated: Patient Education method: Explanation, Demonstration, and Handouts Education comprehension: verbalized understanding and returned demonstration  HOME EXERCISE PROGRAM: Access Code: [email or web address] URL: [email or web address] Date: 05/09/2022 Prepared by: Carmelina Dane   Exercises - Seated Isometric Knee Extension (Into wall, not leg)  - 1 x daily - 7 x weekly - 2 sets - 2 minutes hold - Mini Squat with Counter Support  - 1 x daily - 7 x weekly - 3 sets - 10 reps - Sidelying Hip Abduction  - 1 x daily - 7 x weekly - 3 sets - 10 reps   ASSESSMENT:   CLINICAL IMPRESSION: Pt responded well to all interventions today, demonstrating good form and no pain with selected exercises. She will continue to benefit from skilled PT to address her primary impairments and return to her  prior level of function with less limitation.   OBJECTIVE IMPAIRMENTS: Abnormal gait, decreased activity tolerance, decreased balance, decreased endurance, decreased mobility, difficulty walking, decreased ROM, decreased strength, hypomobility, increased edema, impaired flexibility, improper body mechanics, postural dysfunction, and pain.    ACTIVITY LIMITATIONS: carrying, lifting, bending, sitting, standing, squatting, sleeping, stairs, transfers, and locomotion level   PARTICIPATION LIMITATIONS: meal prep, cleaning, laundry, driving, shopping, community activity, occupation, and yard work   PERSONAL FACTORS: Time since onset of injury/illness/exacerbation and 1-2 comorbidities: See medical hx  are also affecting patient's functional outcome.        GOALS: Goals reviewed with patient? Yes   SHORT TERM GOALS: Target date: 06/06/2022  Pt will report understanding and adherence to initial HEP in order to promote independence in the management of primary impairments. Baseline: HEP provided at eval Goal status: INITIAL       LONG TERM GOALS: Target date: 07/04/2022    Pt will achieve an LEFS score improvement of 11 points or more in order to demonstrate improved functional ability as it relates to the pt's primary impairments. Baseline (05/15/2022): 66/80 Goal status: INITIAL   2.  Pt will report ability to stand 30 minutes or longer in order to cook with less limitation.  Baseline: Unable to stand >5 minutes due to pain Goal status: INITIAL   3.  Pt will achieve a 5xSTS in 12 seconds or less in order to demonstrate safe community-level transfers. Baseline: 18 seconds Goal status: INITIAL   4.  Pt will achieve WNL squat x10 in order to lift groceries from the floor with less limitation. Baseline: 50% squat depth with pain Goal status: INITIAL   5.  Pt will achieve BIL global hip and knee strength of 4+/5 or greater in order to progress her independent LE strengthening regimen with  less limitation. Baseline: See MMT chart Goal status: INITIAL       PLAN:   PT FREQUENCY: 1x/week   PT DURATION: 8 weeks   PLANNED INTERVENTIONS: Therapeutic exercises, Therapeutic activity, Neuromuscular re-education, Balance training, Gait training, Patient/Family education, Self Care, Joint mobilization, Joint manipulation, Stair training, Orthotic/Fit training, Aquatic Therapy, Dry Needling, Electrical stimulation, Spinal manipulation, Spinal mobilization, Cryotherapy, Moist heat, Taping, Vasopneumatic device, Biofeedback, Ionotophoresis 4mg /ml Dexamethasone, Manual therapy, and Re-evaluation   PLAN FOR NEXT SESSION: Assess LEFS at treatment one and update LTG, progress quad reinforcement/ hip strengthening, generalized LE mobility   , PT, DPT 05/15/22 11:11 AM

## 2022-05-17 ENCOUNTER — Other Ambulatory Visit: Payer: Self-pay

## 2022-05-17 ENCOUNTER — Emergency Department (HOSPITAL_COMMUNITY): Payer: Medicaid Other

## 2022-05-17 ENCOUNTER — Encounter (HOSPITAL_COMMUNITY): Payer: Self-pay

## 2022-05-17 ENCOUNTER — Emergency Department (HOSPITAL_COMMUNITY)
Admission: EM | Admit: 2022-05-17 | Discharge: 2022-05-17 | Disposition: A | Discharge status: Home / Self Care | Payer: Medicaid Other | Attending: Emergency Medicine | Admitting: Emergency Medicine

## 2022-05-17 DIAGNOSIS — M25511 Pain in right shoulder: Secondary | ICD-10-CM | POA: Diagnosis not present

## 2022-05-17 DIAGNOSIS — J069 Acute upper respiratory infection, unspecified: Secondary | ICD-10-CM | POA: Insufficient documentation

## 2022-05-17 DIAGNOSIS — R059 Cough, unspecified: Secondary | ICD-10-CM | POA: Diagnosis not present

## 2022-05-17 DIAGNOSIS — B9789 Other viral agents as the cause of diseases classified elsewhere: Secondary | ICD-10-CM | POA: Diagnosis not present

## 2022-05-17 DIAGNOSIS — Z1152 Encounter for screening for COVID-19: Secondary | ICD-10-CM | POA: Insufficient documentation

## 2022-05-17 DIAGNOSIS — M1612 Unilateral primary osteoarthritis, left hip: Secondary | ICD-10-CM | POA: Diagnosis not present

## 2022-05-17 DIAGNOSIS — J029 Acute pharyngitis, unspecified: Secondary | ICD-10-CM | POA: Diagnosis present

## 2022-05-17 LAB — I-STAT BETA HCG BLOOD, ED (MC, WL, AP ONLY): I-stat hCG, quantitative: <5 m[IU]/mL (ref <5)

## 2022-05-17 LAB — GROUP A STREP BY PCR: Group A Strep by PCR: NOT DETECTED

## 2022-05-17 LAB — RESP PANEL BY RT-PCR (FLU A&B, COVID) ARPGX2
Influenza A by PCR: NEGATIVE
Influenza B by PCR: NEGATIVE
SARS Coronavirus 2 by RT PCR: NEGATIVE

## 2022-05-17 MED ORDER — BENZONATATE 100 MG PO CAPS: 100.0000 mg | ORAL_CAPSULE | Freq: Three times a day (TID) | ORAL | 0 refills | Status: DC

## 2022-05-17 MED ORDER — ACETAMINOPHEN 500 MG PO TABS: 500.0000 mg | ORAL_TABLET | Freq: Four times a day (QID) | ORAL | 0 refills | Status: AC | PRN

## 2022-05-17 MED ORDER — IBUPROFEN 600 MG PO TABS: 600.0000 mg | ORAL_TABLET | Freq: Four times a day (QID) | ORAL | 0 refills | Status: AC | PRN

## 2022-05-17 MED ORDER — BENZONATATE 100 MG PO CAPS
200.0000 mg | ORAL_CAPSULE | Freq: Once | ORAL | Status: AC
  Administered 2022-05-17: 200 mg via ORAL
  Filled 2022-05-17: qty 2

## 2022-05-17 MED ORDER — IBUPROFEN 400 MG PO TABS
600.0000 mg | ORAL_TABLET | Freq: Once | ORAL | Status: AC
  Administered 2022-05-17: 600 mg via ORAL
  Filled 2022-05-17: qty 1

## 2022-05-17 NOTE — ED Triage Notes (Signed)
Pt arrived POV from home c/o a sore throat and having pain in her left hip and right shoulder after a fall in Sept.

## 2022-05-17 NOTE — Discharge Instructions (Addendum)
??? ?? ?? ??? ????? ?????? ????? ???? ???? ??? ???? ?????? ?? ?????? ????? ???    1. ??????? ?? ?? ?? ????? ????? ???? ???? ??? 2. ?????? ?? ????? ???? ???? ????? ????? ??? ????? ???? ???? ???? ??? ??? ?? ??? ??? 3 ???? ????? 3. ??????? ???? ????? ???? ????? ????? ????  ?????? ??? ???? ???? ?????? ????? ??????? ?? ????? ?? 3 ??? 5 ????? ???? ??????? ?? ????? ?????? ??? ???? ????? ??? ????? ???????? ??? ????? ??? ??? ?????? ??? ????? ??? ????? ??? ?????!  (Your x-rays all look normal today.  Your that your symptoms are improving.  I sent the following medications to your pharmacy  Ibuprofen.  This is for pain and swelling from your fall Tylenol.  This is a similar medication that may help your pain, it is safe to take both of these together, alternating every 3 hours Benzonatate.  These are pills that should help your cough  Over-the-counter options for congestion include Mucinex.  Do not take this medication for more than 3 to 5 days.  Be sure to stay hydrated.  Return with any worsening symptoms, otherwise please see your PCP.  It was a pleasure to meet you and hope you feel better!)

## 2022-05-17 NOTE — ED Provider Notes (Signed)
[facility] [place] EMERGENCY DEPARTMENT Provider Note   CSN: [id number] Arrival date & time: 05/17/22  1200     History  Chief Complaint  Patient presents with   Sore Throat    Joanna Ashley is a 36 y.o. 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  First she reports that she has had a sore throat, cough and congestion since Monday.  Has not tried any over-the-counter medications.  Believes that she got sick from someone in her apartment who is also been sick.  No chest pain or shortness of breath.  Secondly she complains of pain to her shoulders and back after a fall that occurred in September.  Reports that she slipped and fell while hanging curtains.  Did not hit her head or lose consciousness.  Also has not tried any medications for this or seen her family medicine doctor.   Sore Throat       Home Medications Prior to Admission medications   Not on File      Allergies    Other    Review of Systems   Review of Systems  Physical Exam Updated Vital Signs BP 122/78   Pulse 84   Temp 98.2 F (36.8 C) (Oral)   Resp 16   LMP 05/01/2022 (Exact Date) Comment: pt states she is not pregnant  SpO2 100%  Physical Exam Vitals and nursing note reviewed.  Constitutional:      General: She is not in acute distress.    Appearance: Normal appearance. She is not ill-appearing.     Comments: Hoarse voice  HENT:     Head: Normocephalic and atraumatic.     Mouth/Throat:     Tonsils: No tonsillar exudate or tonsillar abscesses.  Eyes:     General: No scleral icterus.    Conjunctiva/sclera: Conjunctivae normal.  Cardiovascular:     Rate and Rhythm: Normal rate and regular rhythm.  Pulmonary:     Effort: Pulmonary effort is normal. No respiratory distress.     Breath sounds: No wheezing or rales.  Skin:    Findings: No rash.  Neurological:     Mental Status: She is alert.  Psychiatric:         Mood and Affect: Mood normal.     ED Results / Procedures / Treatments   Labs (all labs ordered are listed, but only abnormal results are displayed) Labs Reviewed  RESP PANEL BY RT-PCR (FLU A&B, COVID) ARPGX2  GROUP A STREP BY PCR  I-STAT BETA HCG BLOOD, ED ([facility], [facility], [facility] ONLY)    EKG None  Radiology DG Shoulder Right  Result Date: 05/17/2022 CLINICAL DATA:  Pain from fall. EXAM: RIGHT SHOULDER - 2+ VIEW COMPARISON:  None available. FINDINGS: There is no evidence of fracture or dislocation. There is no evidence of arthropathy or other focal bone abnormality. Soft tissues are unremarkable. IMPRESSION: Negative. Electronically Signed   By: Zetta Bills M.D.   On: 05/17/2022 14:16   DG Hip Unilat W or Wo Pelvis 2-3 Views Left  Result Date: 05/17/2022 CLINICAL DATA:  Fall, pain after fall. EXAM: DG HIP (WITH OR WITHOUT PELVIS) 2-3V LEFT COMPARISON:  None available. FINDINGS: Mild degenerative changes of bilateral hips. No acute fracture of the bony pelvis. No sign of fracture or dislocation about the LEFT hip. IMPRESSION: Mild degenerative changes without signs of fracture or dislocation. Electronically Signed   By: Zetta Bills M.D.   On: 05/17/2022  14:15    Procedures Procedures   Medications Ordered in ED Medications  benzonatate (TESSALON) capsule 200 mg (has no administration in time range)  ibuprofen (ADVIL) tablet 600 mg (has no administration in time range)    ED Course/ Medical Decision Making/ A&P                           Medical Decision Making Amount and/or Complexity of Data Reviewed Radiology: ordered.  Risk OTC drugs. Prescription drug management.   36 year old female presenting today with URI symptoms as well is concerns for pain after a fall.  MSK x-rays were ordered in triage.  I viewed and interpreted these and agree with radiology that there are no signs of acute fracture.  Her hip did show some degenerative changes.  Physical exam: Largely  benign.  Oropharynx clear, tolerating secretions.  Lung sounds clear  Imaging: Ordered, viewed and interpreted chest x-ray which was also benign.  Agree with radiology  Treatment: Given benzonatate and ibuprofen in the department  DM/disposition: 36 year old female who presented due to pain from a fall in September as well is URI symptoms since Monday.  Does not appear to have an emergent condition requiring intervention today.  Lung sounds clear and hemodynamically stable with normal vital signs.  Neurovascularly intact and ambulatory as far as her fall goes.  She will follow-up with PCP this week and I will prescribe ibuprofen, Tylenol and benzonatate.  I will also give her a list of medications that may be helpful over-the-counter for her congestion.  She is agreeable to this plan.  This entire encounter was performed with the telephone interpreter for Mona.   Final Clinical Impression(s) / ED Diagnoses Final diagnoses:  Viral URI with cough    Rx / DC Orders ED Discharge Orders          Ordered    ibuprofen (ADVIL) 600 MG tablet  Every 6 hours PRN        05/17/22 1537    benzonatate (TESSALON) 100 MG capsule  Every 8 hours        05/17/22 1537    acetaminophen (TYLENOL) 500 MG tablet  Every 6 hours PRN        05/17/22 1537           Results and diagnoses were explained to the patient. Return precautions discussed in full. Patient had no additional questions and expressed complete understanding.   This chart was dictated using voice recognition software.  Despite best efforts to proofread,  errors can occur which can change the documentation meaning.    Rhae Hammock, PA-C 05/17/22 1604    Tretha Sciara, MD 05/17/22 1607

## 2022-05-17 NOTE — ED Provider Triage Note (Signed)
Emergency Medicine Provider Triage Evaluation Note  Joanna Ashley , a 36 y.o. female  was evaluated in triage.  Pt complains of sore throat for last several days with pain in neck. Additionally endorses left hip and right shoulder pain for the last several months after fall in September. Denies numbness, tingling.  Review of Systems  Positive: Sore throat, MSK pain Negative: Numbness, inability to swallow, shob  Physical Exam  BP 122/78   Pulse 84   Temp 98.2 F (36.8 C) (Oral)   Resp 16   LMP 04/01/2022 (Exact Date)   SpO2 100%  Gen:   Awake, no distress   Resp:  Normal effort  MSK:   Moves extremities without difficulty  Other:  Minimally erythematous post oropharynx, some mild TTP over left hip and right shoulder, no stepoffs or deformities  Medical Decision Making  Medically screening exam initiated at 1:09 PM.  Appropriate orders placed.  Joanna Ashley was informed that the remainder of the evaluation will be completed by another provider, this initial triage assessment does not replace that evaluation, and the importance of remaining in the ED until their evaluation is complete.  Workup initiated   Anselmo Pickler, [place] 05/17/22 1311

## 2022-05-21 ENCOUNTER — Ambulatory Visit: Payer: Medicaid Other | Admitting: Physical Therapy

## 2022-05-21 ENCOUNTER — Encounter: Payer: Self-pay | Admitting: Physical Therapy

## 2022-05-21 DIAGNOSIS — R600 Localized edema: Secondary | ICD-10-CM

## 2022-05-21 DIAGNOSIS — M6281 Muscle weakness (generalized): Secondary | ICD-10-CM

## 2022-05-21 DIAGNOSIS — M25562 Pain in left knee: Secondary | ICD-10-CM | POA: Diagnosis not present

## 2022-05-21 DIAGNOSIS — G8929 Other chronic pain: Secondary | ICD-10-CM

## 2022-05-21 DIAGNOSIS — M25561 Pain in right knee: Secondary | ICD-10-CM | POA: Diagnosis not present

## 2022-05-21 NOTE — Therapy (Signed)
OUTPATIENT PHYSICAL THERAPY TREATMENT NOTE   Patient Name: Joanna Ashley MRN: [id number] DOB:22-Jan-1986, 36 y.o., female Today's Date: 05/21/2022  PCP: Joanna Key, DO  REFERRING PROVIDER: Thurman Coyer, DO   END OF SESSION:   PT End of Session - 05/21/22 1742     Visit Number 3    Number of Visits 9    Date for PT Re-Evaluation 07/11/22    Authorization Type [employer]    Authorization Time Period 05/08/2022-06/07/2022    Authorization - Visit Number 3    Authorization - Number of Visits 4    PT Start Time [id number]    PT Stop Time 0625    PT Time Calculation (min) 40 min    Activity Tolerance Patient tolerated treatment well    Behavior During Therapy [facility] for tasks assessed/performed             Past Medical History:  Diagnosis Date   Anxiety    Chronic tension headaches 08/22/2019   History reviewed. No pertinent surgical history. Patient Active Problem List   Diagnosis Date Noted   Urinary frequency 12/30/2021   Patellofemoral pain syndrome of right knee 11/08/2021   Contact dermatitis 01/02/2021   UTI (urinary tract infection) 01/02/2021   Bleeding from the nose 01/02/2021   Peripheral neuropathy 11/09/2020   Dysmenorrhea 04/26/2020   Attempting to conceive 04/26/2020   Bilateral knee pain 01/04/2020   Vitamin D deficiency 03/18/2019   COVID-19 virus detected 03/18/2019    REFERRING DIAG: M25.561,M25.562,G89.29 (ICD-10-CM) - Bilateral chronic knee pain   THERAPY DIAG:  Chronic pain of left knee  Chronic pain of right knee  Muscle weakness (generalized)  Localized edema  Rationale for Evaluation and Treatment Rehabilitation  PERTINENT HISTORY: Anxiety, chronic headaches    SUBJECTIVE:                                                                                                                                                                                      SUBJECTIVE STATEMENT:  Video interpreter used throughout.  Pt reports that  her pain is "on and off" but overall has improved somewhat.    PAIN:  Are you having pain? Yes: NPRS scale: 3-4/10 Pain location: BIL knees Pain description: Achy Aggravating factors: sitting in low seats, standing from sitting, standing > 5 minutes, stairs Relieving factors: walking, cycling   OBJECTIVE: (objective measures completed at initial evaluation unless otherwise dated)   DIAGNOSTIC FINDINGS:  12/11/2020: DG Knee Complete 4 Views Right: IMPRESSION: Minimal patellofemoral degenerative change   12/11/2020: DG Knee Complete 4 Views Left: IMPRESSION: Negative   PATIENT SURVEYS:  LEFS: 66/80   COGNITION: Overall cognitive  status: Within functional limits for tasks assessed                         SENSATION: Not tested   EDEMA:  Circumferential: 43cm on Lt, 41.5cm on Rt, from inferior patellar pole to popliteal fossa   MUSCLE LENGTH: Hamstrings: Moderate limitation BIL Thomas test: Moderate limitation BIL   POSTURE: No Significant postural limitations   PALPATION: TTP to BIL patellar tendon, medial border of patellae    PASSIVE ACCESSORIES: Hypomobile with crepitus in all patellofemoral joint mobility   LOWER EXTREMITY ROM:   A/PROM Right eval Left eval  Knee flexion 120/125 120/125  Knee extension -2/1 -1/1   (Blank rows = not tested)   LOWER EXTREMITY MMT:   MMT Right eval Left eval  Hip flexion 3/5 3/5  Hip extension 3/5 3/5  Hip abduction 3/5 3/5  Knee flexion 4/5  4/5  Knee extension 4/5 with crepitus and pain 4/5 with crepitus and pain   (Blank rows = not tested)   LOWER EXTREMITY SPECIAL TESTS:  Patellar apprehension: (+) BIL Lateral patellar pull:(+) BIL Patellar compression: (+) BIL   FUNCTIONAL TESTS:  5xSTS: 18 seconds with pain Squat: 50%, pain Lunge: 50%, pain BIL   GAIT: Distance walked: 20 ft Assistive device utilized: None Level of assistance: Complete Independence Comments: Short stride length, antalgic BIL      TODAY'S TREATMENT:                                                        [facility] Adult PT Treatment:                                                DATE: 05/21/2022 Therapeutic Exercise: nu-step L5 45m while taking subjective and planning session with patient 2-inch lateral heel tap with UE support 2x10 BIL Wall squat - 3x10 Side knee plank with clamshell 2x10 BIL Figure 4 bridge - 2x10 Knee ext machine - 3x10 - 20# Knee flexion machine - 2x10 - 35#                                                                        [facility] Adult PT Treatment:                                                DATE: 05/08/2022 Therapeutic Exercise: Demonstrated and issued HEP Pt education regarding potential underlying pathophysiology, POC, prognosis, and LEFS Manual Therapy: N/A Neuromuscular re-ed: N/A Therapeutic Activity: N/A Modalities: N/A Self Care: N/A       HOME EXERCISE PROGRAM: Access Code: [email or web address] URL: [email or web address] Date: 05/09/2022 Prepared by: Joanna Ashley   Exercises - Seated Isometric Knee Extension (Into wall, not leg)  - 1 x daily - 7 x weekly -  2 sets - 2 minutes hold - Mini Squat with Counter Support  - 1 x daily - 7 x weekly - 3 sets - 10 reps - Sidelying Hip Abduction  - 1 x daily - 7 x weekly - 3 sets - 10 reps   ASSESSMENT:   CLINICAL IMPRESSION: [name] tolerated session well with no adverse reaction.  We concentrated on open and closed chain quad strengthening to good effect.  Will continue to progress as tolerated.   OBJECTIVE IMPAIRMENTS: Abnormal gait, decreased activity tolerance, decreased balance, decreased endurance, decreased mobility, difficulty walking, decreased ROM, decreased strength, hypomobility, increased edema, impaired flexibility, improper body mechanics, postural dysfunction, and pain.    ACTIVITY LIMITATIONS: carrying, lifting, bending, sitting, standing, squatting, sleeping, stairs, transfers, and locomotion level    PARTICIPATION LIMITATIONS: meal prep, cleaning, laundry, driving, shopping, community activity, occupation, and yard work   PERSONAL FACTORS: Time since onset of injury/illness/exacerbation and 1-2 comorbidities: See medical hx  are also affecting patient's functional outcome.        GOALS: Goals reviewed with patient? Yes   SHORT TERM GOALS: Target date: 06/06/2022  Pt will report understanding and adherence to initial HEP in order to promote independence in the management of primary impairments. Baseline: HEP provided at eval Goal status: INITIAL       LONG TERM GOALS: Target date: 07/04/2022    Pt will achieve an LEFS score improvement of 11 points or more in order to demonstrate improved functional ability as it relates to the pt's primary impairments. Baseline (05/15/2022): 66/80 Goal status: INITIAL   2.  Pt will report ability to stand 30 minutes or longer in order to cook with less limitation.  Baseline: Unable to stand >5 minutes due to pain Goal status: INITIAL   3.  Pt will achieve a 5xSTS in 12 seconds or less in order to demonstrate safe community-level transfers. Baseline: 18 seconds Goal status: INITIAL   4.  Pt will achieve WNL squat x10 in order to lift groceries from the floor with less limitation. Baseline: 50% squat depth with pain Goal status: INITIAL   5.  Pt will achieve BIL global hip and knee strength of 4+/5 or greater in order to progress her independent LE strengthening regimen with less limitation. Baseline: See MMT chart Goal status: INITIAL       PLAN:   PT FREQUENCY: 1x/week   PT DURATION: 8 weeks   PLANNED INTERVENTIONS: Therapeutic exercises, Therapeutic activity, Neuromuscular re-education, Balance training, Gait training, Patient/Family education, Self Care, Joint mobilization, Joint manipulation, Stair training, Orthotic/Fit training, Aquatic Therapy, Dry Needling, Electrical stimulation, Spinal manipulation, Spinal mobilization,  Cryotherapy, Moist heat, Taping, Vasopneumatic device, Biofeedback, Ionotophoresis 4mg /ml Dexamethasone, Manual therapy, and Re-evaluation   PLAN FOR NEXT SESSION: Assess LEFS at treatment one and update LTG, progress quad reinforcement/ hip strengthening, generalized LE mobility   PT 05/21/22 6:30 PM

## 2022-05-27 ENCOUNTER — Telehealth: Payer: Self-pay

## 2022-05-27 ENCOUNTER — Ambulatory Visit: Payer: Medicaid Other

## 2022-05-27 NOTE — Telephone Encounter (Signed)
Made in error

## 2022-05-30 ENCOUNTER — Encounter: Payer: Self-pay | Admitting: Physical Therapy

## 2022-05-30 ENCOUNTER — Ambulatory Visit: Payer: Medicaid Other | Admitting: Physical Therapy

## 2022-05-30 DIAGNOSIS — M6281 Muscle weakness (generalized): Secondary | ICD-10-CM | POA: Diagnosis not present

## 2022-05-30 DIAGNOSIS — R600 Localized edema: Secondary | ICD-10-CM | POA: Diagnosis not present

## 2022-05-30 DIAGNOSIS — M25562 Pain in left knee: Secondary | ICD-10-CM | POA: Diagnosis not present

## 2022-05-30 DIAGNOSIS — G8929 Other chronic pain: Secondary | ICD-10-CM | POA: Diagnosis not present

## 2022-05-30 DIAGNOSIS — M25561 Pain in right knee: Secondary | ICD-10-CM | POA: Diagnosis not present

## 2022-05-30 NOTE — Therapy (Signed)
OUTPATIENT PHYSICAL THERAPY TREATMENT NOTE   Patient Name: Joanna Ashley MRN: [id number] DOB:1985/10/08, 36 y.o., female Today's Date: 05/30/2022  PCP: Cora Collum, DO  REFERRING PROVIDER: Ralene Cork, DO   END OF SESSION:   PT End of Session - 05/30/22 1220     Visit Number 4    Number of Visits 5    Date for PT Re-Evaluation 07/11/22    Authorization Type [employer]    Authorization Time Period 05/08/2022-06/07/2022    Authorization - Visit Number 4    Authorization - Number of Visits 5    PT Start Time 1220    PT Stop Time 1300    PT Time Calculation (min) 40 min    Activity Tolerance Patient tolerated treatment well    Behavior During Therapy [facility] for tasks assessed/performed             Past Medical History:  Diagnosis Date   Anxiety    Chronic tension headaches 08/22/2019   History reviewed. No pertinent surgical history. Patient Active Problem List   Diagnosis Date Noted   Urinary frequency 12/30/2021   Patellofemoral pain syndrome of right knee 11/08/2021   Contact dermatitis 01/02/2021   UTI (urinary tract infection) 01/02/2021   Bleeding from the nose 01/02/2021   Peripheral neuropathy 11/09/2020   Dysmenorrhea 04/26/2020   Attempting to conceive 04/26/2020   Bilateral knee pain 01/04/2020   Vitamin D deficiency 03/18/2019   COVID-19 virus detected 03/18/2019    REFERRING DIAG: M25.561,M25.562,G89.29 (ICD-10-CM) - Bilateral chronic knee pain   THERAPY DIAG:  Chronic pain of left knee  Chronic pain of right knee  Muscle weakness (generalized)  Localized edema  Rationale for Evaluation and Treatment Rehabilitation  PERTINENT HISTORY: Anxiety, chronic headaches    SUBJECTIVE:                                                                                                                                                                                      SUBJECTIVE STATEMENT:  Video interpreter used throughout.  Pt reports that  for the last 2 days she has had some increased pain in her knees.  Today she has no pain, but for the last 2 days it was around a 4/10   PAIN:  Are you having pain? Yes: NPRS scale: 0/10 Pain location: BIL knees Pain description: Achy Aggravating factors: sitting in low seats, standing from sitting, standing > 5 minutes, stairs Relieving factors: walking, cycling   OBJECTIVE: (objective measures completed at initial evaluation unless otherwise dated)   DIAGNOSTIC FINDINGS:  12/11/2020: DG Knee Complete 4 Views Right: IMPRESSION: Minimal patellofemoral degenerative change   12/11/2020: DG  Knee Complete 4 Views Left: IMPRESSION: Negative   PATIENT SURVEYS:  LEFS: 66/80   COGNITION: Overall cognitive status: Within functional limits for tasks assessed                         SENSATION: Not tested   EDEMA:  Circumferential: 43cm on Lt, 41.5cm on Rt, from inferior patellar pole to popliteal fossa   MUSCLE LENGTH: Hamstrings: Moderate limitation BIL Thomas test: Moderate limitation BIL   POSTURE: No Significant postural limitations   PALPATION: TTP to BIL patellar tendon, medial border of patellae    PASSIVE ACCESSORIES: Hypomobile with crepitus in all patellofemoral joint mobility   LOWER EXTREMITY ROM:   A/PROM Right eval Left eval  Knee flexion 120/125 120/125  Knee extension -2/1 -1/1   (Blank rows = not tested)   LOWER EXTREMITY MMT:   MMT Right eval Left eval  Hip flexion 3/5 3/5  Hip extension 3/5 3/5  Hip abduction 3/5 3/5  Knee flexion 4/5  4/5  Knee extension 4/5 with crepitus and pain 4/5 with crepitus and pain   (Blank rows = not tested)   LOWER EXTREMITY SPECIAL TESTS:  Patellar apprehension: (+) BIL Lateral patellar pull:(+) BIL Patellar compression: (+) BIL   FUNCTIONAL TESTS:  5xSTS: 18 seconds with pain Squat: 50%, pain Lunge: 50%, pain BIL   GAIT: Distance walked: 20 ft Assistive device utilized: None Level of assistance:  Complete Independence Comments: Short stride length, antalgic BIL     TODAY'S TREATMENT:                                                        [facility] Adult PT Treatment:                                                DATE: 05/30/2022 Therapeutic Exercise: nu-step L6 62m while taking subjective and planning session with patient 4 in step up - 2x10 w/ march fwd Lat 4 in step up - 2x10 TKE - purple band - 20x ea Wall squat - 3x10 Side knee plank with clamshell 2x10 BIL Figure 4 bridge - 2x10 Knee ext machine - 3x10 - 25# Knee flexion machine - 2x10 - 45#                                                                        [facility] Adult PT Treatment:                                                DATE: 05/08/2022 Therapeutic Exercise: Demonstrated and issued HEP Pt education regarding potential underlying pathophysiology, POC, prognosis, and LEFS Manual Therapy: N/A Neuromuscular re-ed: N/A Therapeutic Activity: N/A Modalities: N/A Self Care: N/A       HOME EXERCISE PROGRAM:  Access Code: [email or web address] URL: [email or web address] Date: 05/09/2022 Prepared by: Carmelina Dane   Exercises - Seated Isometric Knee Extension (Into wall, not leg)  - 1 x daily - 7 x weekly - 2 sets - 2 minutes hold - Mini Squat with Counter Support  - 1 x daily - 7 x weekly - 3 sets - 10 reps - Sidelying Hip Abduction  - 1 x daily - 7 x weekly - 3 sets - 10 reps   ASSESSMENT:   CLINICAL IMPRESSION: [place] tolerated session well with no adverse reaction.  [place]a requires moderate cuing throughout for form and pacing.  We were able to add in 4'' step up today which did cause a light increase in pain which dissipated following exercise.  Concentration should remain on quad strengthening.  Will continue to progress as tolerated.   OBJECTIVE IMPAIRMENTS: Abnormal gait, decreased activity tolerance, decreased balance, decreased endurance, decreased mobility, difficulty walking, decreased  ROM, decreased strength, hypomobility, increased edema, impaired flexibility, improper body mechanics, postural dysfunction, and pain.    ACTIVITY LIMITATIONS: carrying, lifting, bending, sitting, standing, squatting, sleeping, stairs, transfers, and locomotion level   PARTICIPATION LIMITATIONS: meal prep, cleaning, laundry, driving, shopping, community activity, occupation, and yard work   PERSONAL FACTORS: Time since onset of injury/illness/exacerbation and 1-2 comorbidities: See medical hx  are also affecting patient's functional outcome.        GOALS: Goals reviewed with patient? Yes   SHORT TERM GOALS: Target date: 06/06/2022  Pt will report understanding and adherence to initial HEP in order to promote independence in the management of primary impairments. Baseline: HEP provided at eval Goal status: INITIAL       LONG TERM GOALS: Target date: 07/04/2022    Pt will achieve an LEFS score improvement of 11 points or more in order to demonstrate improved functional ability as it relates to the pt's primary impairments. Baseline (05/15/2022): 66/80 Goal status: INITIAL   2.  Pt will report ability to stand 30 minutes or longer in order to cook with less limitation.  Baseline: Unable to stand >5 minutes due to pain Goal status: INITIAL   3.  Pt will achieve a 5xSTS in 12 seconds or less in order to demonstrate safe community-level transfers. Baseline: 18 seconds Goal status: INITIAL   4.  Pt will achieve WNL squat x10 in order to lift groceries from the floor with less limitation. Baseline: 50% squat depth with pain Goal status: INITIAL   5.  Pt will achieve BIL global hip and knee strength of 4+/5 or greater in order to progress her independent LE strengthening regimen with less limitation. Baseline: See MMT chart Goal status: INITIAL       PLAN:   PT FREQUENCY: 1x/week   PT DURATION: 8 weeks   PLANNED INTERVENTIONS: Therapeutic exercises, Therapeutic activity,  Neuromuscular re-education, Balance training, Gait training, Patient/Family education, Self Care, Joint mobilization, Joint manipulation, Stair training, Orthotic/Fit training, Aquatic Therapy, Dry Needling, Electrical stimulation, Spinal manipulation, Spinal mobilization, Cryotherapy, Moist heat, Taping, Vasopneumatic device, Biofeedback, Ionotophoresis 4mg /ml Dexamethasone, Manual therapy, and Re-evaluation   PLAN FOR NEXT SESSION: Assess LEFS at treatment one and update LTG, progress quad reinforcement/ hip strengthening, generalized LE mobility   PT 05/30/22 1:03 PM

## 2022-06-02 ENCOUNTER — Ambulatory Visit: Payer: Medicaid Other | Admitting: Physical Therapy

## 2022-06-10 ENCOUNTER — Encounter: Payer: Self-pay | Admitting: Physical Therapy

## 2022-06-10 ENCOUNTER — Ambulatory Visit: Payer: Medicaid Other | Admitting: Physical Therapy

## 2022-06-10 DIAGNOSIS — G8929 Other chronic pain: Secondary | ICD-10-CM | POA: Diagnosis not present

## 2022-06-10 DIAGNOSIS — M25562 Pain in left knee: Secondary | ICD-10-CM | POA: Diagnosis not present

## 2022-06-10 DIAGNOSIS — M6281 Muscle weakness (generalized): Secondary | ICD-10-CM | POA: Diagnosis not present

## 2022-06-10 DIAGNOSIS — R600 Localized edema: Secondary | ICD-10-CM

## 2022-06-10 DIAGNOSIS — M25561 Pain in right knee: Secondary | ICD-10-CM | POA: Diagnosis not present

## 2022-06-10 NOTE — Therapy (Addendum)
OUTPATIENT PHYSICAL THERAPY TREATMENT NOTE + NO VISIT DISCHARGE SUMMARY   Patient Name: Joanna Ashley MRN: [id number] DOB:21-Dec-1985, 36 y.o., female Today's Date: 06/10/2022  PCP: Cora Collum, DO  REFERRING PROVIDER: Ralene Cork, DO   END OF SESSION:   PT End of Session - 06/10/22 1502     Visit Number 5    Number of Visits 9    Date for PT Re-Evaluation 07/11/22    Authorization Type [employer]    Authorization Time Period 05/08/2022-06/07/2022    Authorization - Visit Number 5    Authorization - Number of Visits 5    PT Start Time 1504   pt late arrival   PT Stop Time 1544    PT Time Calculation (min) 40 min    Activity Tolerance Patient tolerated treatment well    Behavior During Therapy [facility] for tasks assessed/performed              Past Medical History:  Diagnosis Date   Anxiety    Chronic tension headaches 08/22/2019   History reviewed. No pertinent surgical history. Patient Active Problem List   Diagnosis Date Noted   Urinary frequency 12/30/2021   Patellofemoral pain syndrome of right knee 11/08/2021   Contact dermatitis 01/02/2021   UTI (urinary tract infection) 01/02/2021   Bleeding from the nose 01/02/2021   Peripheral neuropathy 11/09/2020   Dysmenorrhea 04/26/2020   Attempting to conceive 04/26/2020   Bilateral knee pain 01/04/2020   Vitamin D deficiency 03/18/2019   COVID-19 virus detected 03/18/2019    REFERRING DIAG: M25.561,M25.562,G89.29 (ICD-10-CM) - Bilateral chronic knee pain   THERAPY DIAG:  Chronic pain of left knee  Chronic pain of right knee  Muscle weakness (generalized)  Localized edema  Rationale for Evaluation and Treatment Rehabilitation  PERTINENT HISTORY: Anxiety, chronic headaches    SUBJECTIVE:                                                                                                                                                                                      SUBJECTIVE STATEMENT:    Use of video interpreter for most of session, appreciate their assist. Near end of session pt requests cessation of video interpreter in favor of her son's assistance, which is respected. Pt states R leg was bothering her more yesterday, continues to have moderate pain overall. Pt states that she felt better after last session but continues to have pain in thigh, knee, and lower leg, only on R side. Notes that pain is fluctuating.   PAIN:  Are you having pain? Yes: NPRS scale: 0/10 Pain location: BIL knees Pain description: Achy Aggravating factors: sitting in low  seats, standing from sitting, standing > 5 minutes, stairs Relieving factors: walking, cycling   OBJECTIVE: (objective measures completed at initial evaluation unless otherwise dated)   DIAGNOSTIC FINDINGS:  12/11/2020: DG Knee Complete 4 Views Right: IMPRESSION: Minimal patellofemoral degenerative change   12/11/2020: DG Knee Complete 4 Views Left: IMPRESSION: Negative   PATIENT SURVEYS:  LEFS: 66/80 LEFS 06/10/22: attempted but unable to complete given difficulty w/ video interpreting    COGNITION: Overall cognitive status: Within functional limits for tasks assessed                         SENSATION: Not tested   EDEMA:  Circumferential: 43cm on Lt, 41.5cm on Rt, from inferior patellar pole to popliteal fossa   MUSCLE LENGTH: Hamstrings: Moderate limitation BIL Thomas test: Moderate limitation BIL   POSTURE: No Significant postural limitations   PALPATION: TTP to BIL patellar tendon, medial border of patellae    PASSIVE ACCESSORIES: Hypomobile with crepitus in all patellofemoral joint mobility   LOWER EXTREMITY ROM:   A/PROM Right eval Left eval  Knee flexion 120/125 120/125  Knee extension -2/1 -1/1   (Blank rows = not tested)   LOWER EXTREMITY MMT:   MMT Right eval Left eval Right/left 05/2822  Hip flexion 3/5 3/5 4/4  Hip extension 3/5 3/5   Hip abduction 3/5 3/5 4/4  Knee flexion 4/5  4/5  4+p!/4+  Knee extension 4/5 with crepitus and pain 4/5 with crepitus and pain 4+p!/4+   (Blank rows = not tested)   LOWER EXTREMITY SPECIAL TESTS:  Patellar apprehension: (+) BIL Lateral patellar pull:(+) BIL Patellar compression: (+) BIL   FUNCTIONAL TESTS:  5xSTS: 18 seconds with pain Squat: 50%, pain Lunge: 50%, pain BIL  06/10/22 5xSTS: 16sec w/ pain 06/10/22: Squat 75% with pain, non worsening   GAIT: Distance walked: 20 ft Assistive device utilized: None Level of assistance: Complete Independence Comments: Short stride length, antalgic BIL     TODAY'S TREATMENT:                                                       [facility] Adult PT Treatment:                                                DATE: 06/10/22 Therapeutic Exercise: 5xSTS lowest mat X12 4inch step ups each LE, at counter, cues for form and pacing BTB standing TKE B, 2x15 each, cues for reduced compensation at ankle/hip BW squat x10 w/ gentle UE support on thighs, cues for form and pacing   Unbilled time spent w/ progress note, discussion re: pt progress and POC, attempt at LEFS.   [facility] Adult [facility]:                                                DATE: 05/30/2022 Therapeutic Exercise: nu-step L6 6229m while taking subjective and planning session with patient 4 in step up - 2x10 w/ march fwd Lat 4 in step up - 2x10 TKE - purple band - 20x  ea Wall squat - 3x10 Side knee plank with clamshell 2x10 BIL Figure 4 bridge - 2x10 Knee ext machine - 3x10 - 25# Knee flexion machine - 2x10 - 45#       HOME EXERCISE PROGRAM: Access Code: [email or web address] URL: [email or web address] Date: 05/09/2022 Prepared by: Carmelina Dane   Exercises - Seated Isometric Knee Extension (Into wall, not leg)  - 1 x daily - 7 x weekly - 2 sets - 2 minutes hold - Mini Squat with Counter Support  - 1 x daily - 7 x weekly - 3 sets - 10 reps - Sidelying Hip Abduction  - 1 x daily - 7 x weekly - 3 sets - 10 reps    ASSESSMENT:   CLINICAL IMPRESSION: Pt arrives with mild knee pain R>L, reports limited improvement since beginning therapy. Attempted LEFS on this date with limited success due to difficulty with video interpreter, unable to score. Objectively, pt with modest improvement in 5xSTS and functional squat as noted below although remains limited by moderate pain. Pt also with improvements in hip/knee strength although remains limited by pain/weakness. Functionally pt reports most difficulty with stair navigation and prolonged walking, continues with moderate pain levels overall. Pt reports mild increase in pain with today's exercise that does not persist. Would continue to benefit from increased quad strengthening in open/closed chain. Recommend continuing along current POC as pt is currently progressing slowly/steadily although continues w/ functional limitations   OBJECTIVE IMPAIRMENTS: Abnormal gait, decreased activity tolerance, decreased balance, decreased endurance, decreased mobility, difficulty walking, decreased ROM, decreased strength, hypomobility, increased edema, impaired flexibility, improper body mechanics, postural dysfunction, and pain.    ACTIVITY LIMITATIONS: carrying, lifting, bending, sitting, standing, squatting, sleeping, stairs, transfers, and locomotion level   PARTICIPATION LIMITATIONS: meal prep, cleaning, laundry, driving, shopping, community activity, occupation, and yard work   PERSONAL FACTORS: Time since onset of injury/illness/exacerbation and 1-2 comorbidities: See medical hx  are also affecting patient's functional outcome.        GOALS: Goals reviewed with patient? Yes   SHORT TERM GOALS: Target date: 06/06/2022  Pt will report understanding and adherence to initial HEP in order to promote independence in the management of primary impairments. Baseline: HEP provided at eval Goal status: INITIAL       LONG TERM GOALS: Target date: 07/04/2022    Pt will  achieve an LEFS score improvement of 11 points or more in order to demonstrate improved functional ability as it relates to the pt's primary impairments. Baseline (05/15/2022): 66/80 06/10/22: unable to test   Goal status: INITIAL   2.  Pt will report ability to stand 30 minutes or longer in order to cook with less limitation.  Baseline: Unable to stand >5 minutes due to pain Goal status: ONGOING   3.  Pt will achieve a 5xSTS in 12 seconds or less in order to demonstrate safe community-level transfers. Baseline: 18 seconds 06/10/22: 16sec  Goal status: PROGRESSING    4.  Pt will achieve WNL squat x10 in order to lift groceries from the floor with less limitation. Baseline: 50% squat depth with pain 06/10/22: 75% with pain, worsening  Goal status: ONGOING   5.  Pt will achieve BIL global hip and knee strength of 4+/5 or greater in order to progress her independent LE strengthening regimen with less limitation. Baseline: See MMT chart 06/10/22: see MMT chart Goal status: PROGRESSING       PLAN:   PT FREQUENCY: 1x/week   PT DURATION: 8 weeks   PLANNED  INTERVENTIONS: Therapeutic exercises, Therapeutic activity, Neuromuscular re-education, Balance training, Gait training, Patient/Family education, Self Care, Joint mobilization, Joint manipulation, Stair training, Orthotic/Fit training, Aquatic Therapy, Dry Needling, Electrical stimulation, Spinal manipulation, Spinal mobilization, Cryotherapy, Moist heat, Taping, Vasopneumatic device, Biofeedback, Ionotophoresis 4mg /ml Dexamethasone, Manual therapy, and Re-evaluation   PLAN FOR NEXT SESSION: continue with LE strengthening as able/tolerated, emphasis on WB tolerance and functional movements  PT, DPT 06/10/2022 5:28 PM   Check all possible CPT codes: 06/12/2022 - PT Re-evaluation, 97110- Therapeutic Exercise, [phone]- Neuro Re-education, [phone] - Gait Training, [phone] - Manual Therapy, [phone] - Therapeutic Activities, and [phone] -  Self Care    Check all conditions that are expected to impact treatment: Musculoskeletal disorders   If treatment provided at initial evaluation, no treatment charged due to lack of authorization.     PHYSICAL THERAPY DISCHARGE SUMMARY  Visits from Start of Care: 5  Current functional level related to goals / functional outcomes: Unable to assess - pt not seen in clinic since 06/10/22   Remaining deficits: Unable to assess, see above for most recent deficits   Education / Equipment: Unable to assess   Patient unable to agree to discharge due to lack of follow up. Patient goals were unable to be assessed. Patient is being discharged due to not returning since the last visit.     06/12/22 PT, DPT 07/24/2022 11:43 AM

## 2022-06-25 ENCOUNTER — Ambulatory Visit: Payer: Medicaid Other | Admitting: Family Medicine

## 2022-06-26 ENCOUNTER — Ambulatory Visit
Admission: RE | Admit: 2022-06-26 | Discharge: 2022-06-26 | Disposition: A | Discharge status: Home / Self Care | Payer: Medicaid Other | Source: Ambulatory Visit | Attending: Sports Medicine | Admitting: Sports Medicine

## 2022-06-26 ENCOUNTER — Ambulatory Visit: Payer: Medicaid Other | Admitting: Sports Medicine

## 2022-06-26 VITALS — BP 112/78 | Ht 62.0 in | Wt 207.0 lb

## 2022-06-26 DIAGNOSIS — G8929 Other chronic pain: Secondary | ICD-10-CM

## 2022-06-26 DIAGNOSIS — M25571 Pain in right ankle and joints of right foot: Secondary | ICD-10-CM | POA: Diagnosis not present

## 2022-06-26 DIAGNOSIS — M25561 Pain in right knee: Secondary | ICD-10-CM

## 2022-06-26 NOTE — Progress Notes (Addendum)
   Subjective:    Patient ID: Joanna Ashley, female    DOB: 1986/07/10, 36 y.o.   MRN: [id number]  HPI  Patient presents today for follow-up on bilateral knee pain.  She has attended physical therapy but only had 5 visits.  She would like to attend more if possible.  Left knee pain has improved but right knee pain persists.  Previous x-rays showed some mild patellofemoral DJD.  Her pain is diffuse throughout the knee.  She also has pain distally in the ankle.  History is obtained with the help of an interpreter.   Review of Systems As above    Objective:   Physical Exam  Well-developed, well-nourished.  No acute distress  Right knee: Good range of motion.  Trace effusion.  2+ patellofemoral crepitus on the right.  Tender to palpation diffusely throughout the knee.  Right ankle: Good range of motion.  Tenderness to palpation along the distal fibula.  No obvious swelling.      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  I will call her with those results when available.  Patient is interested in attending a few more physical therapy visits so I have asked her to contact the PT office and see if this is possible.  Insurance may be the reason for limited visits.  We will also order an x-ray of her right ankle.  This note was dictated using Dragon naturally speaking software and may contain errors in syntax, spelling, or content which have not been identified prior to signing this note.   Addendum: X-ray of the right ankle is unremarkable

## 2022-07-18 ENCOUNTER — Ambulatory Visit
Admission: RE | Admit: 2022-07-18 | Discharge: 2022-07-18 | Disposition: A | Discharge status: Home / Self Care | Payer: Medicaid Other | Source: Ambulatory Visit | Attending: Sports Medicine | Admitting: Sports Medicine

## 2022-07-18 DIAGNOSIS — G8929 Other chronic pain: Secondary | ICD-10-CM

## 2022-08-13 ENCOUNTER — Other Ambulatory Visit: Payer: Self-pay

## 2022-08-13 ENCOUNTER — Emergency Department (HOSPITAL_BASED_OUTPATIENT_CLINIC_OR_DEPARTMENT_OTHER)
Admission: EM | Admit: 2022-08-13 | Discharge: 2022-08-13 | Disposition: A | Discharge status: Home / Self Care | Payer: Medicaid Other | Attending: Emergency Medicine | Admitting: Emergency Medicine

## 2022-08-13 ENCOUNTER — Emergency Department (HOSPITAL_COMMUNITY): Payer: Medicaid Other

## 2022-08-13 ENCOUNTER — Emergency Department (HOSPITAL_BASED_OUTPATIENT_CLINIC_OR_DEPARTMENT_OTHER): Payer: Medicaid Other

## 2022-08-13 DIAGNOSIS — E876 Hypokalemia: Secondary | ICD-10-CM | POA: Diagnosis not present

## 2022-08-13 DIAGNOSIS — R7989 Other specified abnormal findings of blood chemistry: Secondary | ICD-10-CM | POA: Diagnosis not present

## 2022-08-13 DIAGNOSIS — J069 Acute upper respiratory infection, unspecified: Secondary | ICD-10-CM | POA: Diagnosis not present

## 2022-08-13 DIAGNOSIS — R059 Cough, unspecified: Secondary | ICD-10-CM | POA: Diagnosis not present

## 2022-08-13 DIAGNOSIS — J40 Bronchitis, not specified as acute or chronic: Secondary | ICD-10-CM | POA: Diagnosis not present

## 2022-08-13 DIAGNOSIS — Z1152 Encounter for screening for COVID-19: Secondary | ICD-10-CM | POA: Insufficient documentation

## 2022-08-13 DIAGNOSIS — J029 Acute pharyngitis, unspecified: Secondary | ICD-10-CM | POA: Diagnosis not present

## 2022-08-13 LAB — CBC WITH DIFFERENTIAL/PLATELET
Abs Immature Granulocytes: 0.03 10*3/uL (ref 0.00–0.07)
Basophils Absolute: 0 10*3/uL (ref 0.0–0.1)
Basophils Relative: 0 %
Eosinophils Absolute: 0.2 10*3/uL (ref 0.0–0.5)
Eosinophils Relative: 2 %
HCT: 36.1 % (ref 36.0–46.0)
Hemoglobin: 12.4 g/dL (ref 12.0–15.0)
Immature Granulocytes: 0 %
Lymphocytes Relative: 24 %
Lymphs Abs: 2.5 10*3/uL (ref 0.7–4.0)
MCH: 30.3 pg (ref 26.0–34.0)
MCHC: 34.3 g/dL (ref 30.0–36.0)
MCV: 88.3 fL (ref 80.0–100.0)
Monocytes Absolute: 0.9 10*3/uL (ref 0.1–1.0)
Monocytes Relative: 9 %
Neutro Abs: 6.7 10*3/uL (ref 1.7–7.7)
Neutrophils Relative %: 65 %
Platelets: 400 10*3/uL (ref 150–400)
RBC: 4.09 MIL/uL (ref 3.87–5.11)
RDW: 12.5 % (ref 11.5–15.5)
WBC: 10.3 10*3/uL (ref 4.0–10.5)
nRBC: 0 % (ref 0.0–0.2)

## 2022-08-13 LAB — BASIC METABOLIC PANEL
Anion gap: 8 (ref 5–15)
BUN: 8 mg/dL (ref 6–20)
CO2: 26 mmol/L (ref 22–32)
Calcium: 9.4 mg/dL (ref 8.9–10.3)
Chloride: 105 mmol/L (ref 98–111)
Creatinine, Ser: 0.43 mg/dL — ABNORMAL LOW (ref 0.44–1.00)
GFR, Estimated: >60 mL/min (ref >60)
Glucose, Bld: 77 mg/dL (ref 70–99)
Potassium: 3.4 mmol/L — ABNORMAL LOW (ref 3.5–5.1)
Sodium: 139 mmol/L (ref 135–145)

## 2022-08-13 LAB — D-DIMER, QUANTITATIVE: D-Dimer, Quant: 0.51 ug/mL-FEU — ABNORMAL HIGH (ref 0.00–0.50)

## 2022-08-13 LAB — RESP PANEL BY RT-PCR (RSV, FLU A&B, COVID)  RVPGX2
Influenza A by PCR: NEGATIVE
Influenza B by PCR: NEGATIVE
Resp Syncytial Virus by PCR: NEGATIVE
SARS Coronavirus 2 by RT PCR: NEGATIVE

## 2022-08-13 LAB — MONONUCLEOSIS SCREEN: Mono Screen: NEGATIVE

## 2022-08-13 LAB — GROUP A STREP BY PCR: Group A Strep by PCR: NOT DETECTED

## 2022-08-13 LAB — HCG, QUANTITATIVE, PREGNANCY: hCG, Beta Chain, Quant, S: <1 m[IU]/mL (ref <5)

## 2022-08-13 MED ORDER — PREDNISONE 10 MG PO TABS: 40.0000 mg | ORAL_TABLET | Freq: Every day | ORAL | 0 refills | Status: AC

## 2022-08-13 MED ORDER — POTASSIUM CHLORIDE CRYS ER 20 MEQ PO TBCR
20.0000 meq | EXTENDED_RELEASE_TABLET | Freq: Once | ORAL | Status: AC
  Administered 2022-08-13: 20 meq via ORAL
  Filled 2022-08-13: qty 1

## 2022-08-13 MED ORDER — IOHEXOL 300 MG/ML  SOLN
100.0000 mL | Freq: Once | INTRAMUSCULAR | Status: AC | PRN
  Administered 2022-08-13: 100 mL via INTRAVENOUS

## 2022-08-13 MED ORDER — BENZONATATE 100 MG PO CAPS: 100.0000 mg | ORAL_CAPSULE | Freq: Three times a day (TID) | ORAL | 0 refills | Status: AC

## 2022-08-13 NOTE — ED Provider Notes (Addendum)
[facility] Provider Note   CSN: [id number] Arrival date & time: 08/13/22  1507     History  Chief Complaint  Patient presents with   Sore Throat    Joanna Ashley is a 37 y.o. female.  HPI   37 year old female with medical history significant for anxiety, self-reported history of gastritis previously on medication who presents to the emergency department with multiple complaints.  The patient history of present illness provided with the aid of a [place] language telemetry interpreter.  The patient states that for about 2 weeks she had symptoms of "gastritis" which she described as burning epigastric and left upper quadrant discomfort.  The symptoms resolved 1 week ago.  She then has developed since subjective fever, chills, sore throat, productive cough.  She states initially her cough was productive of mild bloody sputum but has since turned chunky yellow.  She denies any current chest pain.  She endorses a sore throat that makes it difficult to swallow.  She complains of headache.  She did not check her temperature at home but felt subjectively warm.  Additionally, she states that she developed a lump on her right inner thigh that was painful and red and increased in size yesterday.  It looks like an ingrown hair and has since resolved and improved.  She denies any abdominal pain, diarrhea.  She had an episode of mild bloody nose yesterday and 1 episode of vomiting yesterday.  Home Medications Prior to Admission medications   Medication Sig Start Date End Date Taking? Authorizing Provider  benzonatate (TESSALON) 100 MG capsule Take 1 capsule (100 mg total) by mouth every 8 (eight) hours. 08/13/22  Yes Regan Lemming, MD  predniSONE (DELTASONE) 10 MG tablet Take 4 tablets (40 mg total) by mouth daily for 5 days. 08/13/22 08/18/22 Yes Regan Lemming, MD  acetaminophen (TYLENOL) 500 MG tablet Take 1 tablet (500 mg total) by mouth every 6 (six) hours as  needed. 05/17/22   Redwine, Madison A, PA-C  ibuprofen (ADVIL) 600 MG tablet Take 1 tablet (600 mg total) by mouth every 6 (six) hours as needed. 05/17/22   Redwine, Madison A, PA-C      Allergies    Other    Review of Systems   Review of Systems  All other systems reviewed and are negative.   Physical Exam Updated Vital Signs BP 128/75 (BP Location: Right Arm)   Pulse 82   Temp 98.4 F (36.9 C) (Oral)   Resp 20   SpO2 100%  Physical Exam Vitals and nursing note reviewed.  Constitutional:      General: She is not in acute distress.    Appearance: She is well-developed.  HENT:     Head: Normocephalic and atraumatic.     Right Ear: Tympanic membrane and ear canal normal.     Left Ear: Hearing, tympanic membrane and ear canal normal.     Nose: Nose normal.     Right Nostril: No epistaxis.     Left Nostril: No epistaxis.     Comments: No evidence of epistaxis, no lesions    Mouth/Throat:     Pharynx: No pharyngeal swelling or oropharyngeal exudate.     Tonsils: No tonsillar exudate or tonsillar abscesses.     Comments: Oropharynx without erythema or swelling, good range of motion of the neck, no evidence for PTA no tonsillar swelling or exudate. Eyes:     Conjunctiva/sclera: Conjunctivae normal.  Cardiovascular:     Rate and  Rhythm: Normal rate and regular rhythm.     Heart sounds: No murmur heard. Pulmonary:     Effort: Pulmonary effort is normal. No tachypnea or respiratory distress.     Breath sounds: Normal breath sounds.     Comments: Lungs clear to auscultation bilaterally Abdominal:     Palpations: Abdomen is soft.     Tenderness: There is no abdominal tenderness.  Musculoskeletal:        General: No swelling.     Cervical back: Neck supple.     Comments: Right inner thigh observed, small red lesion consistent with likely ingrown hair, no palpable fluctuance, no induration, no surrounding cellulitis  Skin:    General: Skin is warm and dry.     Capillary Refill:  Capillary refill takes less than 2 seconds.  Neurological:     Mental Status: She is alert.  Psychiatric:        Mood and Affect: Mood normal.     ED Results / Procedures / Treatments   Labs (all labs ordered are listed, but only abnormal results are displayed) Labs Reviewed  BASIC METABOLIC PANEL - Abnormal; Notable for the following components:      Result Value   Potassium 3.4 (*)    Creatinine, Ser 0.43 (*)    All other components within normal limits  D-DIMER, QUANTITATIVE - Abnormal; Notable for the following components:   D-Dimer, Quant 0.51 (*)    All other components within normal limits  RESP PANEL BY RT-PCR (RSV, FLU A&B, COVID)  [id number]  GROUP A STREP BY PCR  CBC WITH DIFFERENTIAL/PLATELET  MONONUCLEOSIS SCREEN  HCG, QUANTITATIVE, PREGNANCY  QUANTIFERON-TB GOLD PLUS    EKG None  Radiology CT Angio Chest PE W and/or Wo Contrast  Result Date: 08/13/2022 CLINICAL DATA:  Rule out pulmonary embolism. Positive D-dimer. Complains of sore throat and esophagus which has worsened over the past week and makes it difficult to swallow. Also complains of productive cough. EXAM: CT ANGIOGRAPHY CHEST WITH CONTRAST TECHNIQUE: Multidetector CT imaging of the chest was performed using the standard protocol during bolus administration of intravenous contrast. Multiplanar CT image reconstructions and MIPs were obtained to evaluate the vascular anatomy. RADIATION DOSE REDUCTION: This exam was performed according to the departmental dose-optimization program which includes automated exposure control, adjustment of the mA and/or kV according to patient size and/or use of iterative reconstruction technique. CONTRAST:  173mL OMNIPAQUE IOHEXOL 300 MG/ML  SOLN COMPARISON:  None Available. FINDINGS: Cardiovascular: Satisfactory opacification of the pulmonary arteries to the segmental level. No evidence of pulmonary embolism. Normal heart size. No pericardial effusion. Mediastinum/Nodes: No enlarged  mediastinal, hilar, or axillary lymph nodes. Thyroid gland, trachea, and esophagus demonstrate no significant findings. Lungs/Pleura: No pleural fluid. No airspace consolidation identified. No signs of pneumothorax. Scar like density noted within the periphery of the right upper lobe. Upper Abdomen: No acute abnormality. Musculoskeletal: No chest wall abnormality. No acute or significant osseous findings. Review of the MIP images confirms the above findings. IMPRESSION: 1. No evidence for acute pulmonary embolism. 2. Postinflammatory, scar like density noted within the periphery of the right upper lobe. Electronically Signed   By: Kerby Moors M.D.   On: 08/13/2022 18:21   DG Chest Portable 1 View  Result Date: 08/13/2022 CLINICAL DATA:  Cough for 1 week, sore throat, difficulty eating and swallowing, productive cough, blood in mucus EXAM: PORTABLE CHEST 1 VIEW COMPARISON:  Portable exam 1636 hours compared to 05/17/2022 FINDINGS: Normal heart size, mediastinal contours, and pulmonary  vascularity. Lungs clear. No pleural effusion or pneumothorax. Bones unremarkable. IMPRESSION: No acute abnormalities. Electronically Signed   By: Ulyses Southward M.D.   On: 08/13/2022 16:43    Procedures Procedures    Medications Ordered in ED Medications  potassium chloride SA (KLOR-CON M) CR tablet 20 mEq (20 mEq Oral Given 08/13/22 1816)  iohexol (OMNIPAQUE) 300 MG/ML solution 100 mL (100 mLs Intravenous Contrast Given 08/13/22 1746)    ED Course/ Medical Decision Making/ A&P Clinical Course as of 08/13/22 1845  Wed Aug 13, 2022  1817 D-Dimer, Quant(!): 0.51 [JL]  1817 Potassium(!): 3.4 [JL]    Clinical Course User Index [JL] Ernie Avena, MD                             Medical Decision Making Amount and/or Complexity of Data Reviewed Labs: ordered. Decision-making details documented in ED Course. Radiology: ordered.  Risk Prescription drug management.    37 year old female with medical history  significant for anxiety, self-reported history of gastritis previously on medication who presents to the emergency department with multiple complaints.  The patient history of present illness provided with the aid of a [place] language telemetry interpreter.  The patient states that for about 2 weeks she had symptoms of "gastritis" which she described as burning epigastric and left upper quadrant discomfort.  The symptoms resolved 1 week ago.  She then has developed since subjective fever, chills, sore throat, productive cough.  She states initially her cough was productive of mild bloody sputum but has since turned chunky yellow.  She denies any current chest pain.  She endorses a sore throat that makes it difficult to swallow.  She complains of headache.  She did not check her temperature at home but felt subjectively warm.  Additionally, she states that she developed a lump on her right inner thigh that was painful and red and increased in size yesterday.  It looks like an ingrown hair and has since resolved and improved.  She denies any abdominal pain, diarrhea.  She had an episode of mild bloody nose yesterday and 1 episode of vomiting yesterday.  On arrival, the patient was afebrile, not tachycardic or tachypneic, BP 128/75, saturating her percent on room air.  Sickle exam significant for lungs clear to auscultation bilaterally, oropharynx without erythema or swelling, good range of motion of the neck, no evidence for PTA, no tonsillar swelling or exudate, no evidence of epistaxis, TMs clear bilaterally, skin exam reassuring of the right thigh.  Patient symptoms are distant most likely with viral upper respiratory infection, also considered bacterial pneumonia.  Given the patient's complaint of mild hemoptysis earlier in the week, consider bronchitis versus PE as the patient is PERC positive due to her reported hemoptysis.  Lower suspicion but consented for testing for TB.  The patient was aware that TB  testing will not result during this visit.  COVID-19, influenza, RSV PCR testing was collected and resulted negative.  Mononucleosis screen, TB, D-dimer, group A strep, CBC, BMP all collected.  Chest x-ray was performed.  Labs: hCG negative, D-dimer mildly elevated at 0.51, CBC without a leukocytosis or anemia, group A strep not detected, mononucleosis screen negative, BMP with mild hypokalemia to 3.4, otherwise unremarkable.  Quant of urine collected and pending.  CXR: No acute abnormality.  Given the patient's report of hemoptysis, positive D-dimer, discussed CTA imaging to further evaluate which the patient consented.  The patient's potassium was replenished orally.  CTA imaging results include the following: IMPRESSION:  1. No evidence for acute pulmonary embolism.  2. Postinflammatory, scar like density noted within the periphery of  the right upper lobe.   Regarding the patient's complaint of reported gastritis, symptoms could be due to GERD versus gastritis versus peptic ulcer disease.  Her symptoms have resolved over the past week.  I did recommend that the patient follow-up with her PCP.  Patient symptoms are consistent with acute viral bronchitis.  Will treat with a course of Tessalon and steroids and have the patient follow-up outpatient.  Will follow-up on the patient's quantiferon on as well.    Final Clinical Impression(s) / ED Diagnoses Final diagnoses:  Viral URI with cough  Bronchitis    Rx / DC Orders ED Discharge Orders          Ordered    benzonatate (TESSALON) 100 MG capsule  Every 8 hours        08/13/22 1841    predniSONE (DELTASONE) 10 MG tablet  Daily        08/13/22 1841              Regan Lemming, MD 08/13/22 1843    Regan Lemming, MD 08/13/22 1845

## 2022-08-13 NOTE — Discharge Instructions (Addendum)
Your symptoms are consistent with likely viral upper respiratory infection.  Your laboratory testing was reassuring.  The only component of your laboratory testing which is pending is your TB screening which will be available on our patient portal.  If for any reason it is positive, we will contact you with results.  Recommend Tylenol and ibuprofen for pain control.  Antitussives and a course of steroids have been prescribed.  Your CT imaging revealed the following: IMPRESSION:  1. No evidence for acute pulmonary embolism.  2. Postinflammatory, scar like density noted within the periphery of  the right upper lobe.

## 2022-08-13 NOTE — ED Triage Notes (Signed)
Pt arrived POV, caox4, ambulatory. Interpretor used, primary Gaffer.   Pt c/o "sore throat and esophagus" that worsened over the past week and has now made it difficult to eat/swallow. Pt also c/o productive cough stating she is concerned because there has been blood in the mucous. Pt also c/o headache and fever/chills, afebrile at present.     Pt states for approx 2 months she has had an ongoing cold that briefly improved and that also had gastritis along with the ongoing cold.   Pt further states she would like "a possible abscess or cyst" checked out on her upper thigh.

## 2022-08-13 NOTE — ED Notes (Signed)
Translator used for all teaching. Pt verbalized understanding of d/c instructions, meds, and followup care. Denies questions. VSS, no distress noted. Steady gait to exit with all belongings.

## 2022-08-19 LAB — QUANTIFERON-TB GOLD PLUS (RQFGPL)
QuantiFERON Mitogen Value: >10 IU/mL
QuantiFERON Nil Value: 0.05 IU/mL
QuantiFERON TB1 Ag Value: >10 IU/mL
QuantiFERON TB2 Ag Value: >10 IU/mL

## 2022-08-19 LAB — QUANTIFERON-TB GOLD PLUS: QuantiFERON-TB Gold Plus: POSITIVE — AB

## 2022-10-27 ENCOUNTER — Inpatient Hospital Stay: Admission: RE | Admit: 2022-10-27 | Payer: Medicaid Other | Source: Ambulatory Visit

## 2024-06-18 ENCOUNTER — Encounter (HOSPITAL_BASED_OUTPATIENT_CLINIC_OR_DEPARTMENT_OTHER): Payer: Self-pay

## 2024-06-18 ENCOUNTER — Other Ambulatory Visit: Payer: Self-pay

## 2024-06-18 ENCOUNTER — Emergency Department (HOSPITAL_BASED_OUTPATIENT_CLINIC_OR_DEPARTMENT_OTHER)
Admission: EM | Admit: 2024-06-18 | Discharge: 2024-06-18 | Disposition: A | Discharge status: Home / Self Care | Attending: Emergency Medicine | Admitting: Emergency Medicine

## 2024-06-18 ENCOUNTER — Emergency Department (HOSPITAL_BASED_OUTPATIENT_CLINIC_OR_DEPARTMENT_OTHER)

## 2024-06-18 DIAGNOSIS — M79605 Pain in left leg: Secondary | ICD-10-CM | POA: Diagnosis not present

## 2024-06-18 LAB — COMPREHENSIVE METABOLIC PANEL WITH GFR
ALT: 26 U/L (ref 0–44)
AST: 28 U/L (ref 15–41)
Albumin: 4.2 g/dL (ref 3.5–5.0)
Alkaline Phosphatase: 63 U/L (ref 38–126)
Anion gap: 10 (ref 5–15)
BUN: 8 mg/dL (ref 6–20)
CO2: 24 mmol/L (ref 22–32)
Calcium: 9.3 mg/dL (ref 8.9–10.3)
Chloride: 107 mmol/L (ref 98–111)
Creatinine, Ser: 0.5 mg/dL (ref 0.44–1.00)
GFR, Estimated: >60 mL/min (ref >60)
Glucose, Bld: 89 mg/dL (ref 70–99)
Potassium: 3.5 mmol/L (ref 3.5–5.1)
Sodium: 141 mmol/L (ref 135–145)
Total Bilirubin: 0.3 mg/dL (ref 0.0–1.2)
Total Protein: 7.2 g/dL (ref 6.5–8.1)

## 2024-06-18 LAB — CBC WITH DIFFERENTIAL/PLATELET
Abs Immature Granulocytes: 0.01 K/uL (ref 0.00–0.07)
Basophils Absolute: 0 K/uL (ref 0.0–0.1)
Basophils Relative: 1 %
Eosinophils Absolute: 0.1 K/uL (ref 0.0–0.5)
Eosinophils Relative: 2 %
HCT: 38.3 % (ref 36.0–46.0)
Hemoglobin: 13.2 g/dL (ref 12.0–15.0)
Immature Granulocytes: 0 %
Lymphocytes Relative: 36 %
Lymphs Abs: 2.5 K/uL (ref 0.7–4.0)
MCH: 30.3 pg (ref 26.0–34.0)
MCHC: 34.5 g/dL (ref 30.0–36.0)
MCV: 88 fL (ref 80.0–100.0)
Monocytes Absolute: 0.6 K/uL (ref 0.1–1.0)
Monocytes Relative: 9 %
Neutro Abs: 3.5 K/uL (ref 1.7–7.7)
Neutrophils Relative %: 52 %
Platelets: 367 K/uL (ref 150–400)
RBC: 4.35 MIL/uL (ref 3.87–5.11)
RDW: 12.2 % (ref 11.5–15.5)
WBC: 6.8 K/uL (ref 4.0–10.5)
nRBC: 0 % (ref 0.0–0.2)

## 2024-06-18 LAB — HCG, SERUM, QUALITATIVE: Preg, Serum: NEGATIVE

## 2024-06-18 MED ORDER — PREDNISONE 50 MG PO TABS: 50.0000 mg | ORAL_TABLET | Freq: Every day | ORAL | 0 refills | Status: AC

## 2024-06-18 NOTE — ED Notes (Signed)
 Reviewed AVS/discharge instruction with patient. Time allotted for and all questions answered. Patient is agreeable for d/c and escorted to ed exit by staff.

## 2024-06-18 NOTE — ED Triage Notes (Addendum)
 Arrives ambulatory to the ED with complaints of left leg/foot pain and swelling x2 weeks. No falls or injuries.  Tigrinian interpreter utilized for triage.

## 2024-06-18 NOTE — Discharge Instructions (Signed)
 It was a pleasure taking care of you here today  Make sure to elevate the leg.  Take prednisone  over the next 5 days.  Follow-up with primary care provider  Return for any worsening symptoms

## 2024-06-18 NOTE — ED Provider Notes (Signed)
 [place] EMERGENCY DEPARTMENT AT [facility] Provider Note   CSN: [id number] Arrival date & time: 06/18/24  1502     Patient presents with: Leg Pain (Left ) and Leg Swelling   Joanna Ashley is a 38 y.o. female here for evaluation of left leg pain and swelling.  Symptoms over the last 2 weeks.  She denies any recent falls or injuries.  Typically when she would elevate her left leg her swelling would improve however has not over the last few days.  Pain goes from her left lateral aspect distal tib-fib into the posterior aspect of her left thigh.  No back pain.  No numbness, weakness.  No bowel or bladder incontinence, saddle paresthesia.  Has never had anything like this previously.  No chest pain, shortness of breath, history of PE, DVT, recent surgery, immobilization or malignancy.  No redness to leg.  No rashes or lesions  Tigrinian interpreter used   HPI     Prior to Admission medications   Medication Sig Start Date End Date Taking? Authorizing Provider  predniSONE  (DELTASONE ) 50 MG tablet Take 1 tablet (50 mg total) by mouth daily for 5 days. 06/18/24 06/23/24 Yes Maeson Lourenco A, PA-C  acetaminophen  (TYLENOL ) 500 MG tablet Take 1 tablet (500 mg total) by mouth every 6 (six) hours as needed. 05/17/22   Redwine, Madison A, PA-C  benzonatate  (TESSALON ) 100 MG capsule Take 1 capsule (100 mg total) by mouth every 8 (eight) hours. 08/13/22   Jerrol Agent, MD  ibuprofen  (ADVIL ) 600 MG tablet Take 1 tablet (600 mg total) by mouth every 6 (six) hours as needed. 05/17/22   Redwine, Madison A, PA-C    Allergies: Other    Review of Systems  Constitutional: Negative.   HENT: Negative.    Respiratory: Negative.    Cardiovascular: Negative.   Gastrointestinal: Negative.   Genitourinary: Negative.   Musculoskeletal:        Left leg swelling  Skin: Negative.   Neurological: Negative.   All other systems reviewed and are negative.   Updated Vital Signs BP 131/74   Pulse 80    Temp 98.4 F (36.9 C) (Oral)   Resp 20   Ht 5' 2 (1.575 m)   Wt 93.9 kg   SpO2 99%   BMI 37.86 kg/m   Physical Exam Vitals and nursing note reviewed.  Constitutional:      General: She is not in acute distress.    Appearance: She is well-developed. She is not ill-appearing, toxic-appearing or diaphoretic.  HENT:     Head: Normocephalic and atraumatic.     Nose: Nose normal.     Mouth/Throat:     Mouth: Mucous membranes are moist.  Eyes:     Pupils: Pupils are equal, round, and reactive to light.  Cardiovascular:     Rate and Rhythm: Normal rate.     Pulses: Normal pulses.          Radial pulses are 2+ on the right side and 2+ on the left side.       Dorsalis pedis pulses are 2+ on the right side and 2+ on the left side.     Heart sounds: Normal heart sounds.  Pulmonary:     Effort: Pulmonary effort is normal. No respiratory distress.     Breath sounds: Normal breath sounds.  Abdominal:     General: Bowel sounds are normal. There is no distension.     Palpations: Abdomen is soft.     Tenderness:  There is no abdominal tenderness. There is no guarding or rebound.  Musculoskeletal:        General: Tenderness present. No swelling, deformity or signs of injury. Normal range of motion.     Cervical back: Normal range of motion.     Right lower leg: No edema.     Left lower leg: No edema.     Comments: No bony tenderness, compartment soft, full range of motion.  Negative straight leg raise.  No obvious joint effusion.  Soft tissue edema left lateral aspect leg.  Soft tissue tenderness posterior aspect left leg into left lateral calf.  Skin:    General: Skin is warm and dry.     Capillary Refill: Capillary refill takes less than 2 seconds.     Comments: No edema, erythema or warmth.  No fluctuance, induration.  No rashes or lesions  Neurological:     General: No focal deficit present.     Mental Status: She is alert.  Psychiatric:        Mood and Affect: Mood normal.      (all labs ordered are listed, but only abnormal results are displayed) Labs Reviewed  CBC WITH DIFFERENTIAL/PLATELET  COMPREHENSIVE METABOLIC PANEL WITH GFR  HCG, SERUM, QUALITATIVE    EKG: None  Radiology: US  Venous Img Lower Unilateral Left Result Date: 06/18/2024 CLINICAL DATA:  Left lower extremity pain EXAM: LEFT LOWER EXTREMITY VENOUS DOPPLER ULTRASOUND TECHNIQUE: Gray-scale sonography with compression, as well as color and duplex ultrasound, were performed to evaluate the deep venous system(s) from the level of the common femoral vein through the popliteal and proximal calf veins. COMPARISON:  None Available. FINDINGS: VENOUS Normal compressibility of the common femoral, superficial femoral, and popliteal veins, as well as the visualized calf veins. Visualized portions of profunda femoral vein and great saphenous vein unremarkable. No filling defects to suggest DVT on grayscale or color Doppler imaging. Doppler waveforms show normal direction of venous flow, normal respiratory plasticity and response to augmentation. Limited views of the contralateral common femoral vein are unremarkable. OTHER None. Limitations: none IMPRESSION: 1. No evidence of deep venous thrombosis within the left lower extremity. Electronically Signed   By: Ozell Daring M.D.   On: 06/18/2024 17:06     Procedures   Medications Ordered in the ED - No data to display  38 year old here for evaluation of left lower extremity pain.  Over the last 2 weeks.  No redness or warmth.  Initially improved after elevating leg however not improving.  Pain to left distal leg into the posterior aspect of her thigh.  No back pain.  No bowel or bladder incontinence, saddle paresthesia.  Here she is not negative straight leg raise.  She is neurovascularly intact.  Her compartments are soft.  She denies any recent falls or injuries.  No history of PE, DVT, recent surgery, immobilization or malignancy.  No chest pain or shortness  of breath.  Will plan on labs, imaging, reassess.  She does not want a thing for pain at this time.  Labs and imaging personally viewed interpreted CBC without leukocytosis Metabolic panel without significant abnormality Pregnancy test negative Ultrasound negative for DVT  Patient reassessed.  She has some pain from her left piriformis and the posterior aspect of her leg into the left lateral aspect of her leg.  No recent falls or injuries.  No clinical evidence of VTE, ischemia here.  No bony tenderness to suggest fracture, dislocation.  No erythema, warmth. Will have her rest,  elevate the leg.  Short course of steroids.  No bowel or bladder incontinence, saddle paresthesia, fever, history of IVDU.  Will have her follow-up outpatient, return for new or worsening symptoms.  No shortening rotation of legs to suggest hip etiology.  The patient has been appropriately medically screened and/or stabilized in the ED. I have low suspicion for any other emergent medical condition which would require further screening, evaluation or treatment in the ED or require inpatient management.  Patient is hemodynamically stable and in no acute distress.  Patient able to ambulate in department prior to ED.  Evaluation does not show acute pathology that would require ongoing or additional emergent interventions while in the emergency department or further inpatient treatment.  I have discussed the diagnosis with the patient and answered all questions.  Pain is been managed while in the emergency department and patient has no further complaints prior to discharge.  Patient is comfortable with plan discussed in room and is stable for discharge at this time.  I have discussed strict return precautions for returning to the emergency department.  Patient was encouraged to follow-up with PCP/specialist refer to at discharge.                                    Medical Decision Making Amount and/or Complexity of Data  Reviewed External Data Reviewed: labs, radiology and notes. Labs: ordered. Decision-making details documented in ED Course. Radiology: ordered and independent interpretation performed. Decision-making details documented in ED Course.  Risk OTC drugs. Prescription drug management. Decision regarding hospitalization. Diagnosis or treatment significantly limited by social determinants of health.        Final diagnoses:  Pain of left lower extremity    ED Discharge Orders          Ordered    predniSONE  (DELTASONE ) 50 MG tablet  Daily        06/18/24 1809               Jumaane Weatherford A, PA-C 06/18/24 2315    Ruthe Cornet, DO 06/18/24 2321
